# Patient Record
Sex: Male | Born: 1945 | Race: White | Hispanic: No | Marital: Married | Smoking: Former smoker
Health system: Southern US, Community
[De-identification: ages and names within clinical notes are randomized; demographics above are authoritative.]

## PROBLEM LIST (undated history)

## (undated) DIAGNOSIS — S88119A Complete traumatic amputation at level between knee and ankle, unspecified lower leg, initial encounter: Secondary | ICD-10-CM

## (undated) DIAGNOSIS — K259 Gastric ulcer, unspecified as acute or chronic, without hemorrhage or perforation: Secondary | ICD-10-CM

## (undated) DIAGNOSIS — M25562 Pain in left knee: Secondary | ICD-10-CM

## (undated) DIAGNOSIS — M25552 Pain in left hip: Secondary | ICD-10-CM

## (undated) DIAGNOSIS — E785 Hyperlipidemia, unspecified: Secondary | ICD-10-CM

## (undated) DIAGNOSIS — R739 Hyperglycemia, unspecified: Secondary | ICD-10-CM

## (undated) DIAGNOSIS — M1990 Unspecified osteoarthritis, unspecified site: Secondary | ICD-10-CM

## (undated) DIAGNOSIS — G5621 Lesion of ulnar nerve, right upper limb: Secondary | ICD-10-CM

## (undated) DIAGNOSIS — F419 Anxiety disorder, unspecified: Secondary | ICD-10-CM

## (undated) DIAGNOSIS — M542 Cervicalgia: Secondary | ICD-10-CM

## (undated) DIAGNOSIS — Z969 Presence of functional implant, unspecified: Secondary | ICD-10-CM

## (undated) DIAGNOSIS — R569 Unspecified convulsions: Secondary | ICD-10-CM

## (undated) DIAGNOSIS — M5030 Other cervical disc degeneration, unspecified cervical region: Secondary | ICD-10-CM

## (undated) DIAGNOSIS — M549 Dorsalgia, unspecified: Secondary | ICD-10-CM

## (undated) DIAGNOSIS — G8929 Other chronic pain: Secondary | ICD-10-CM

## (undated) DIAGNOSIS — K5790 Diverticulosis of intestine, part unspecified, without perforation or abscess without bleeding: Secondary | ICD-10-CM

## (undated) DIAGNOSIS — M48061 Spinal stenosis, lumbar region without neurogenic claudication: Secondary | ICD-10-CM

## (undated) DIAGNOSIS — S329XXA Fracture of unspecified parts of lumbosacral spine and pelvis, initial encounter for closed fracture: Secondary | ICD-10-CM

## (undated) DIAGNOSIS — M25579 Pain in unspecified ankle and joints of unspecified foot: Secondary | ICD-10-CM

## (undated) DIAGNOSIS — C801 Malignant (primary) neoplasm, unspecified: Secondary | ICD-10-CM

## (undated) DIAGNOSIS — G4700 Insomnia, unspecified: Secondary | ICD-10-CM

## (undated) DIAGNOSIS — G5602 Carpal tunnel syndrome, left upper limb: Secondary | ICD-10-CM

## (undated) HISTORY — DX: Pain in left hip: M25.552

## (undated) HISTORY — PX: TONSILLECTOMY: SUR1361

## (undated) HISTORY — DX: Other cervical disc degeneration, unspecified cervical region: M50.30

## (undated) HISTORY — DX: Pain in left knee: M25.562

## (undated) HISTORY — DX: Carpal tunnel syndrome, left upper limb: G56.02

## (undated) HISTORY — PX: HERNIA REPAIR: SHX51

## (undated) HISTORY — PX: CARPAL TUNNEL RELEASE: SHX101

## (undated) HISTORY — DX: Cervicalgia: M54.2

## (undated) HISTORY — PX: CATARACT EXTRACTION, BILATERAL: SHX1313

## (undated) HISTORY — PX: OTHER SURGICAL HISTORY: SHX169

## (undated) HISTORY — PX: NERVE REPAIR: SHX2083

## (undated) HISTORY — PX: SKIN CANCER EXCISION: SHX779

## (undated) HISTORY — DX: Fracture of unspecified parts of lumbosacral spine and pelvis, initial encounter for closed fracture: S32.9XXA

## (undated) HISTORY — PX: RETINAL DETACHMENT SURGERY: SHX105

## (undated) HISTORY — DX: Gastric ulcer, unspecified as acute or chronic, without hemorrhage or perforation: K25.9

## (undated) HISTORY — DX: Diverticulosis of intestine, part unspecified, without perforation or abscess without bleeding: K57.90

## (undated) HISTORY — DX: Lesion of ulnar nerve, right upper limb: G56.21

---
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
## 1970-07-01 HISTORY — PX: BACK SURGERY: SHX140

## 1987-07-02 DIAGNOSIS — K513 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: Secondary | ICD-10-CM | POA: Insufficient documentation

## 1999-05-30 ENCOUNTER — Emergency Department (HOSPITAL_COMMUNITY): Admission: EM | Admit: 1999-05-30 | Discharge: 1999-05-30 | Payer: Self-pay | Admitting: *Deleted

## 2002-08-09 ENCOUNTER — Encounter: Payer: Self-pay | Admitting: Internal Medicine

## 2002-08-09 ENCOUNTER — Encounter: Admission: RE | Admit: 2002-08-09 | Discharge: 2002-08-09 | Payer: Self-pay | Admitting: Internal Medicine

## 2004-08-14 ENCOUNTER — Encounter: Admission: RE | Admit: 2004-08-14 | Discharge: 2004-08-14 | Payer: Self-pay | Admitting: Internal Medicine

## 2005-05-04 ENCOUNTER — Emergency Department (HOSPITAL_COMMUNITY): Admission: EM | Admit: 2005-05-04 | Discharge: 2005-05-04 | Payer: Self-pay | Admitting: Emergency Medicine

## 2011-10-31 DIAGNOSIS — E785 Hyperlipidemia, unspecified: Secondary | ICD-10-CM | POA: Diagnosis not present

## 2011-10-31 DIAGNOSIS — Z79899 Other long term (current) drug therapy: Secondary | ICD-10-CM | POA: Diagnosis not present

## 2011-10-31 DIAGNOSIS — Z125 Encounter for screening for malignant neoplasm of prostate: Secondary | ICD-10-CM | POA: Diagnosis not present

## 2011-11-01 DIAGNOSIS — Z1212 Encounter for screening for malignant neoplasm of rectum: Secondary | ICD-10-CM | POA: Diagnosis not present

## 2011-11-07 DIAGNOSIS — M25559 Pain in unspecified hip: Secondary | ICD-10-CM | POA: Diagnosis not present

## 2011-11-07 DIAGNOSIS — R7309 Other abnormal glucose: Secondary | ICD-10-CM | POA: Diagnosis not present

## 2011-11-07 DIAGNOSIS — M48061 Spinal stenosis, lumbar region without neurogenic claudication: Secondary | ICD-10-CM | POA: Diagnosis not present

## 2011-11-07 DIAGNOSIS — Z0000 Encounter for general adult medical examination without abnormal findings: Secondary | ICD-10-CM | POA: Diagnosis not present

## 2011-11-12 DIAGNOSIS — H01009 Unspecified blepharitis unspecified eye, unspecified eyelid: Secondary | ICD-10-CM | POA: Diagnosis not present

## 2011-11-12 DIAGNOSIS — H2510 Age-related nuclear cataract, unspecified eye: Secondary | ICD-10-CM | POA: Diagnosis not present

## 2011-11-28 DIAGNOSIS — M5137 Other intervertebral disc degeneration, lumbosacral region: Secondary | ICD-10-CM | POA: Diagnosis not present

## 2011-11-28 DIAGNOSIS — M549 Dorsalgia, unspecified: Secondary | ICD-10-CM | POA: Diagnosis not present

## 2012-01-13 ENCOUNTER — Encounter (HOSPITAL_COMMUNITY): Payer: Self-pay | Admitting: Emergency Medicine

## 2012-01-13 ENCOUNTER — Observation Stay (HOSPITAL_COMMUNITY)
Admission: EM | Admit: 2012-01-13 | Discharge: 2012-01-14 | Disposition: A | Discharge status: Home / Self Care | Payer: Medicare Other | Attending: Internal Medicine | Admitting: Internal Medicine

## 2012-01-13 ENCOUNTER — Emergency Department (HOSPITAL_COMMUNITY): Payer: Medicare Other

## 2012-01-13 DIAGNOSIS — E785 Hyperlipidemia, unspecified: Secondary | ICD-10-CM | POA: Diagnosis not present

## 2012-01-13 DIAGNOSIS — Z8673 Personal history of transient ischemic attack (TIA), and cerebral infarction without residual deficits: Secondary | ICD-10-CM | POA: Diagnosis not present

## 2012-01-13 DIAGNOSIS — M25579 Pain in unspecified ankle and joints of unspecified foot: Secondary | ICD-10-CM | POA: Diagnosis not present

## 2012-01-13 DIAGNOSIS — G8929 Other chronic pain: Secondary | ICD-10-CM | POA: Diagnosis not present

## 2012-01-13 DIAGNOSIS — M545 Low back pain, unspecified: Secondary | ICD-10-CM | POA: Insufficient documentation

## 2012-01-13 DIAGNOSIS — R5383 Other fatigue: Secondary | ICD-10-CM | POA: Diagnosis not present

## 2012-01-13 DIAGNOSIS — G319 Degenerative disease of nervous system, unspecified: Secondary | ICD-10-CM | POA: Diagnosis not present

## 2012-01-13 DIAGNOSIS — R7309 Other abnormal glucose: Secondary | ICD-10-CM | POA: Diagnosis not present

## 2012-01-13 DIAGNOSIS — S82899A Other fracture of unspecified lower leg, initial encounter for closed fracture: Secondary | ICD-10-CM | POA: Diagnosis not present

## 2012-01-13 DIAGNOSIS — R55 Syncope and collapse: Principal | ICD-10-CM | POA: Insufficient documentation

## 2012-01-13 DIAGNOSIS — M62838 Other muscle spasm: Secondary | ICD-10-CM | POA: Insufficient documentation

## 2012-01-13 DIAGNOSIS — R404 Transient alteration of awareness: Secondary | ICD-10-CM | POA: Diagnosis not present

## 2012-01-13 DIAGNOSIS — R569 Unspecified convulsions: Secondary | ICD-10-CM | POA: Diagnosis not present

## 2012-01-13 DIAGNOSIS — R5381 Other malaise: Secondary | ICD-10-CM | POA: Insufficient documentation

## 2012-01-13 DIAGNOSIS — M48061 Spinal stenosis, lumbar region without neurogenic claudication: Secondary | ICD-10-CM | POA: Diagnosis not present

## 2012-01-13 DIAGNOSIS — E876 Hypokalemia: Secondary | ICD-10-CM | POA: Diagnosis not present

## 2012-01-13 DIAGNOSIS — Z79899 Other long term (current) drug therapy: Secondary | ICD-10-CM | POA: Insufficient documentation

## 2012-01-13 DIAGNOSIS — R739 Hyperglycemia, unspecified: Secondary | ICD-10-CM | POA: Diagnosis present

## 2012-01-13 DIAGNOSIS — R509 Fever, unspecified: Secondary | ICD-10-CM | POA: Insufficient documentation

## 2012-01-13 DIAGNOSIS — S88119A Complete traumatic amputation at level between knee and ankle, unspecified lower leg, initial encounter: Secondary | ICD-10-CM | POA: Diagnosis not present

## 2012-01-13 DIAGNOSIS — M25572 Pain in left ankle and joints of left foot: Secondary | ICD-10-CM | POA: Diagnosis present

## 2012-01-13 DIAGNOSIS — R61 Generalized hyperhidrosis: Secondary | ICD-10-CM | POA: Diagnosis not present

## 2012-01-13 HISTORY — DX: Insomnia, unspecified: G47.00

## 2012-01-13 HISTORY — DX: Complete traumatic amputation at level between knee and ankle, unspecified lower leg, initial encounter: S88.119A

## 2012-01-13 HISTORY — DX: Hyperglycemia, unspecified: R73.9

## 2012-01-13 HISTORY — DX: Other chronic pain: G89.29

## 2012-01-13 HISTORY — DX: Spinal stenosis, lumbar region without neurogenic claudication: M48.061

## 2012-01-13 HISTORY — DX: Anxiety disorder, unspecified: F41.9

## 2012-01-13 HISTORY — DX: Pain in unspecified ankle and joints of unspecified foot: M25.579

## 2012-01-13 HISTORY — DX: Unspecified osteoarthritis, unspecified site: M19.90

## 2012-01-13 HISTORY — DX: Dorsalgia, unspecified: M54.9

## 2012-01-13 HISTORY — DX: Hyperlipidemia, unspecified: E78.5

## 2012-01-13 LAB — BASIC METABOLIC PANEL
BUN: 19 mg/dL (ref 6–23)
CO2: 28 mEq/L (ref 19–32)
Calcium: 9.2 mg/dL (ref 8.4–10.5)
Chloride: 101 mEq/L (ref 96–112)
Creatinine, Ser: 0.81 mg/dL (ref 0.50–1.35)
GFR calc Af Amer: >90 mL/min (ref >90)
GFR calc non Af Amer: 89 mL/min — ABNORMAL LOW (ref >90)
Glucose, Bld: 137 mg/dL — ABNORMAL HIGH (ref 70–99)
Potassium: 3.4 mEq/L — ABNORMAL LOW (ref 3.5–5.1)
Sodium: 139 mEq/L (ref 135–145)

## 2012-01-13 LAB — CBC WITH DIFFERENTIAL/PLATELET
Basophils Absolute: 0 10*3/uL (ref 0.0–0.1)
Basophils Relative: 0 % (ref 0–1)
Eosinophils Absolute: 0.4 10*3/uL (ref 0.0–0.7)
Eosinophils Relative: 4 % (ref 0–5)
HCT: 38.4 % — ABNORMAL LOW (ref 39.0–52.0)
Hemoglobin: 12.6 g/dL — ABNORMAL LOW (ref 13.0–17.0)
Lymphocytes Relative: 20 % (ref 12–46)
Lymphs Abs: 2.1 10*3/uL (ref 0.7–4.0)
MCH: 27.2 pg (ref 26.0–34.0)
MCHC: 32.8 g/dL (ref 30.0–36.0)
MCV: 82.8 fL (ref 78.0–100.0)
Monocytes Absolute: 0.9 10*3/uL (ref 0.1–1.0)
Monocytes Relative: 9 % (ref 3–12)
Neutro Abs: 7.1 10*3/uL (ref 1.7–7.7)
Neutrophils Relative %: 67 % (ref 43–77)
Platelets: 398 10*3/uL (ref 150–400)
RBC: 4.64 MIL/uL (ref 4.22–5.81)
RDW: 14.8 % (ref 11.5–15.5)
WBC: 10.5 10*3/uL (ref 4.0–10.5)

## 2012-01-13 LAB — TROPONIN I: Troponin I: <0.3 ng/mL (ref <0.30)

## 2012-01-13 LAB — GLUCOSE, CAPILLARY: Glucose-Capillary: 119 mg/dL — ABNORMAL HIGH (ref 70–99)

## 2012-01-13 NOTE — ED Provider Notes (Signed)
History     CSN: [id number]  Arrival date & time 01/13/12  2128   First MD Initiated Contact with Patient 01/13/12 2301      Chief Complaint  Patient presents with  . Loss of Consciousness    (Consider location/radiation/quality/duration/timing/severity/associated sxs/prior treatment) Patient is a 66 y.o. male presenting with syncope. The history is provided by the patient. No language interpreter was used.  Loss of Consciousness This is a new problem. The current episode started 3 to 5 hours ago. The problem occurs constantly. The problem has been resolved. Pertinent negatives include no chest pain, no abdominal pain, no headaches and no shortness of breath. Nothing aggravates the symptoms. Nothing relieves the symptoms. He has tried nothing for the symptoms. The treatment provided no relief.  No CP, no SOB no n/v/d.  No weakness nor numbness  Past Medical History  Diagnosis Date  . Back pain, chronic     Past Surgical History  Procedure Date  . Below the knee amputaion   . Anterior cervical decomp/discectomy fusion   . Hernia repair     History reviewed. No pertinent family history.  History  Substance Use Topics  . Smoking status: Never Smoker   . Smokeless tobacco: Not on file  . Alcohol Use: No      Review of Systems  Constitutional: Negative for fever.  Respiratory: Negative for shortness of breath.   Cardiovascular: Positive for syncope. Negative for chest pain.  Gastrointestinal: Negative for abdominal pain.  Neurological: Positive for syncope. Negative for headaches.  All other systems reviewed and are negative.    Allergies  Cephalosporins  Home Medications   Current Outpatient Rx  Name Route Sig Dispense Refill  . AMITRIPTYLINE HCL 100 MG PO TABS Oral Take 100 mg by mouth at bedtime.    . COLESTIPOL HCL 1 G PO TABS Oral Take 2 g by mouth 2 (two) times daily.    Marland Kitchen DICLOFENAC SODIUM 75 MG PO TBEC Oral Take 75 mg by mouth 2 (two) times daily.      . OMEGA-3 FATTY ACIDS 1000 MG PO CAPS Oral Take 1 g by mouth 2 (two) times daily before a meal.    . HYDROCODONE-ACETAMINOPHEN 5-325 MG PO TABS Oral Take 1 tablet by mouth every 6 (six) hours as needed. For pain.    Marland Kitchen TRAMADOL HCL 50 MG PO TABS Oral Take 100 mg by mouth every 6 (six) hours as needed. For pain.      BP 139/87  Pulse 71  Temp 97.7 F (36.5 C) (Oral)  Resp 18  SpO2 99%  Physical Exam  Constitutional: He appears well-developed and well-nourished. No distress.  HENT:  Head: Normocephalic.    Right Ear: External ear normal.  Left Ear: External ear normal.  Mouth/Throat: Oropharynx is clear and moist.  Eyes: Conjunctivae and EOM are normal. Pupils are equal, round, and reactive to light.  Neck: Normal range of motion. Neck supple.  Cardiovascular: Normal rate and regular rhythm.   Pulmonary/Chest: Effort normal and breath sounds normal. He has no wheezes. He has no rales.  Abdominal: Soft. Bowel sounds are normal. There is no tenderness. There is no rebound and no guarding.  Musculoskeletal: Normal range of motion. He exhibits no edema.  Neurological: He is alert. He has normal reflexes. No cranial nerve deficit.  Skin: Skin is warm and dry.  Psychiatric: He has a normal mood and affect.    ED Course  Procedures (including critical care time)  Labs Reviewed  CBC WITH DIFFERENTIAL - Abnormal; Notable for the following:    Hemoglobin 12.6 (*)     HCT 38.4 (*)     All other components within normal limits  BASIC METABOLIC PANEL - Abnormal; Notable for the following:    Potassium 3.4 (*)     Glucose, Bld 137 (*)     GFR calc non Af Amer 89 (*)     All other components within normal limits  GLUCOSE, CAPILLARY - Abnormal; Notable for the following:    Glucose-Capillary 119 (*)     All other components within normal limits  TROPONIN I  URINALYSIS, ROUTINE W REFLEX MICROSCOPIC  URINE CULTURE   No results found.   No diagnosis found.    MDM   Date:  01/13/2012  Rate: 69  Rhythm: normal sinus rhythm  QRS Axis: normal  Intervals: normal  ST/T Wave abnormalities: nonspecific ST changes  Conduction Disutrbances:first-degree A-V block   Narrative Interpretation:   Old EKG Reviewed: none available    Based on [place] syncope score patient will require admission.    Case d/w neurology, Dr. Roseanne Reno.  This is a syncopal event as this started while patient was standing and no sz      Adonnis Salceda K Renu Asby-Rasch, MD 01/14/12 [phone]

## 2012-01-13 NOTE — ED Notes (Signed)
Family states this evening pt was standing behind her and she heard him making some strange noises then fell to the ground  Wife states his hands were drawn up and he was grabbing at her and shaking  Wife states after the event he was confused and diaphoretic  EMS came out to the house but pt refused transport  Pt is pale and diaphoretic in triage  Pt is c/o nausea   Pt has a small abrasion noted to the left temporal area from his fall

## 2012-01-13 NOTE — ED Notes (Signed)
Pt's family reports that pt "passed out" and hit left side of head this evening around 8pm. States that pt made a "strange chomping sound" while he was passed out and they thought he may have had a seizure. Pt has no history of seizures. Pt does admit to nausea and dry mouth. States that he did yard work and did not drink "a lot" of water today. Pt denies dysuria, urinary frequency or retention. Pt alert and oriented x 4, neuro intact at this time. Family at the bedside.

## 2012-01-13 NOTE — ED Notes (Signed)
[id number]> Expected date:<BR> Expected time:<BR> Means of arrival:<BR> Comments:<BR> Jill Side

## 2012-01-14 ENCOUNTER — Encounter (HOSPITAL_COMMUNITY): Payer: Self-pay | Admitting: Internal Medicine

## 2012-01-14 DIAGNOSIS — E785 Hyperlipidemia, unspecified: Secondary | ICD-10-CM | POA: Diagnosis present

## 2012-01-14 DIAGNOSIS — M48061 Spinal stenosis, lumbar region without neurogenic claudication: Secondary | ICD-10-CM | POA: Diagnosis present

## 2012-01-14 DIAGNOSIS — E876 Hypokalemia: Secondary | ICD-10-CM | POA: Diagnosis not present

## 2012-01-14 DIAGNOSIS — R7309 Other abnormal glucose: Secondary | ICD-10-CM | POA: Diagnosis not present

## 2012-01-14 DIAGNOSIS — R55 Syncope and collapse: Secondary | ICD-10-CM | POA: Diagnosis not present

## 2012-01-14 DIAGNOSIS — S82899A Other fracture of unspecified lower leg, initial encounter for closed fracture: Secondary | ICD-10-CM | POA: Diagnosis not present

## 2012-01-14 DIAGNOSIS — M25572 Pain in left ankle and joints of left foot: Secondary | ICD-10-CM | POA: Diagnosis present

## 2012-01-14 DIAGNOSIS — R739 Hyperglycemia, unspecified: Secondary | ICD-10-CM | POA: Diagnosis present

## 2012-01-14 LAB — URINALYSIS, ROUTINE W REFLEX MICROSCOPIC
Bilirubin Urine: NEGATIVE
Glucose, UA: NEGATIVE mg/dL
Hgb urine dipstick: NEGATIVE
Ketones, ur: NEGATIVE mg/dL
Leukocytes, UA: NEGATIVE
Nitrite: NEGATIVE
Protein, ur: NEGATIVE mg/dL
Specific Gravity, Urine: 1.027 (ref 1.005–1.030)
Urobilinogen, UA: 0.2 mg/dL (ref 0.0–1.0)
pH: 6.5 (ref 5.0–8.0)

## 2012-01-14 MED ORDER — ACETAMINOPHEN 325 MG PO TABS: 650.0000 mg | ORAL_TABLET | Freq: Four times a day (QID) | ORAL | Status: DC | PRN

## 2012-01-14 MED ORDER — TRAMADOL HCL 50 MG PO TABS: 100.0000 mg | ORAL_TABLET | Freq: Four times a day (QID) | ORAL | Status: DC | PRN

## 2012-01-14 MED ORDER — ALPRAZOLAM 1 MG PO TABS: 1.0000 mg | ORAL_TABLET | Freq: Every day | ORAL | Status: DC

## 2012-01-14 MED ORDER — PROMETHAZINE HCL 25 MG PO TABS: 12.5000 mg | ORAL_TABLET | Freq: Four times a day (QID) | ORAL | Status: DC | PRN

## 2012-01-14 MED ORDER — COLESTIPOL HCL 1 G PO TABS
2.0000 g | ORAL_TABLET | Freq: Two times a day (BID) | ORAL | Status: DC
  Administered 2012-01-14: 2 g via ORAL
  Filled 2012-01-14 (×2): qty 2

## 2012-01-14 MED ORDER — AMITRIPTYLINE HCL 100 MG PO TABS
100.0000 mg | ORAL_TABLET | Freq: Every day | ORAL | Status: DC
  Filled 2012-01-14: qty 1

## 2012-01-14 MED ORDER — ALUM & MAG HYDROXIDE-SIMETH 200-200-20 MG/5ML PO SUSP: 30.0000 mL | Freq: Four times a day (QID) | ORAL | Status: DC | PRN

## 2012-01-14 MED ORDER — MAGNESIUM CITRATE PO SOLN: 1.0000 | Freq: Once | ORAL | Status: DC | PRN

## 2012-01-14 MED ORDER — BISACODYL 5 MG PO TBEC: 5.0000 mg | DELAYED_RELEASE_TABLET | Freq: Every day | ORAL | Status: DC | PRN

## 2012-01-14 MED ORDER — SODIUM CHLORIDE 0.9 % IJ SOLN: 3.0000 mL | Freq: Two times a day (BID) | INTRAMUSCULAR | Status: DC

## 2012-01-14 MED ORDER — TRAMADOL HCL 50 MG PO TABS: 100.0000 mg | ORAL_TABLET | Freq: Three times a day (TID) | ORAL | Status: DC | PRN

## 2012-01-14 MED ORDER — HYDROCODONE-ACETAMINOPHEN 5-325 MG PO TABS: 1.0000 | ORAL_TABLET | Freq: Four times a day (QID) | ORAL | Status: DC | PRN

## 2012-01-14 MED ORDER — ENOXAPARIN SODIUM 40 MG/0.4ML ~~LOC~~ SOLN
40.0000 mg | SUBCUTANEOUS | Status: DC
  Administered 2012-01-14: 40 mg via SUBCUTANEOUS
  Filled 2012-01-14: qty 0.4

## 2012-01-14 MED ORDER — OMEGA-3-ACID ETHYL ESTERS 1 G PO CAPS
1.0000 g | ORAL_CAPSULE | Freq: Two times a day (BID) | ORAL | Status: DC
  Administered 2012-01-14: 1 g via ORAL
  Filled 2012-01-14 (×2): qty 1

## 2012-01-14 MED ORDER — ACETAMINOPHEN 650 MG RE SUPP: 650.0000 mg | Freq: Four times a day (QID) | RECTAL | Status: DC | PRN

## 2012-01-14 MED ORDER — DICLOFENAC SODIUM 75 MG PO TBEC
75.0000 mg | DELAYED_RELEASE_TABLET | Freq: Two times a day (BID) | ORAL | Status: DC
  Administered 2012-01-14: 75 mg via ORAL
  Filled 2012-01-14 (×2): qty 1

## 2012-01-14 MED ORDER — ASPIRIN EC 81 MG PO TBEC
81.0000 mg | DELAYED_RELEASE_TABLET | Freq: Every day | ORAL | Status: DC
  Administered 2012-01-14: 81 mg via ORAL
  Filled 2012-01-14: qty 1

## 2012-01-14 MED ORDER — ALPRAZOLAM 1 MG PO TABS: 1.0000 mg | ORAL_TABLET | Freq: Every day | ORAL | Status: AC

## 2012-01-14 MED ORDER — OMEGA-3 FATTY ACIDS 1000 MG PO CAPS: 1.0000 g | ORAL_CAPSULE | Freq: Two times a day (BID) | ORAL | Status: DC

## 2012-01-14 NOTE — Progress Notes (Signed)
Patient discharge to home no complaints of pain or any discomfort, wife at bedside, d/c instructions and follow up appointments done and given to the wife. PIV removed no s/s of infiltration or swelling noted

## 2012-01-14 NOTE — Progress Notes (Signed)
  Echocardiogram 2D Echocardiogram has been performed.  Tallon Gertz 01/14/2012, 12:27 PM

## 2012-01-14 NOTE — ED Notes (Signed)
Admitting doc at bedside

## 2012-01-14 NOTE — Care Management Note (Unsigned)
    Page 1 of 1   01/14/2012     11:38:31 AM   CARE MANAGEMENT NOTE 01/14/2012  Patient:  Allen Taylor, Allen Taylor   Account Number:  [id number]  Date Initiated:  01/14/2012  Documentation initiated by:  Lanier Clam  Subjective/Objective Assessment:   ADMITTED W/LOSS OF CONSCIOUSNESS.     Action/Plan:   FROM HOME W/SPOUSE.INDEP PTA.   Anticipated DC Date:  01/15/2012   Anticipated DC Plan:  HOME/SELF CARE      DC Planning Services  CM consult      Choice offered to / List presented to:             Status of service:  In process, will continue to follow Medicare Important Message given?   (If response is "NO", the following Medicare IM given date fields will be blank) Date Medicare IM given:   Date Additional Medicare IM given:    Discharge Disposition:    Per UR Regulation:  Reviewed for med. necessity/level of care/duration of stay  If discussed at Long Length of Stay Meetings, dates discussed:    Comments:  01/14/12 [facility] RN,BSN [phone]

## 2012-01-14 NOTE — ED Notes (Signed)
Pt alert and oriented x4. Respirations even and unlabored, bilateral symmetrical rise and fall of chest. Skin warm and dry. In no acute distress. Denies needs.   

## 2012-01-14 NOTE — ED Notes (Signed)
Report given to grace, rn on floor.
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
## 2012-01-15 LAB — URINE CULTURE
Colony Count: NO GROWTH
Culture: NO GROWTH

## 2012-01-20 DIAGNOSIS — M48061 Spinal stenosis, lumbar region without neurogenic claudication: Secondary | ICD-10-CM | POA: Diagnosis not present

## 2012-01-20 DIAGNOSIS — M25579 Pain in unspecified ankle and joints of unspecified foot: Secondary | ICD-10-CM | POA: Diagnosis not present

## 2012-01-20 DIAGNOSIS — E785 Hyperlipidemia, unspecified: Secondary | ICD-10-CM | POA: Diagnosis not present

## 2012-01-20 DIAGNOSIS — Z8669 Personal history of other diseases of the nervous system and sense organs: Secondary | ICD-10-CM | POA: Diagnosis not present

## 2012-02-20 DIAGNOSIS — E785 Hyperlipidemia, unspecified: Secondary | ICD-10-CM | POA: Diagnosis not present

## 2012-02-20 DIAGNOSIS — M25579 Pain in unspecified ankle and joints of unspecified foot: Secondary | ICD-10-CM | POA: Diagnosis not present

## 2012-02-20 DIAGNOSIS — M48061 Spinal stenosis, lumbar region without neurogenic claudication: Secondary | ICD-10-CM | POA: Diagnosis not present

## 2012-04-07 ENCOUNTER — Encounter (HOSPITAL_COMMUNITY): Payer: Self-pay | Admitting: *Deleted

## 2012-04-07 ENCOUNTER — Emergency Department (HOSPITAL_COMMUNITY)
Admission: EM | Admit: 2012-04-07 | Discharge: 2012-04-07 | Disposition: A | Discharge status: Home / Self Care | Payer: Medicare Other | Attending: Emergency Medicine | Admitting: Emergency Medicine

## 2012-04-07 ENCOUNTER — Emergency Department (HOSPITAL_COMMUNITY): Payer: Medicare Other

## 2012-04-07 DIAGNOSIS — Z79899 Other long term (current) drug therapy: Secondary | ICD-10-CM | POA: Diagnosis not present

## 2012-04-07 DIAGNOSIS — R569 Unspecified convulsions: Secondary | ICD-10-CM | POA: Diagnosis not present

## 2012-04-07 DIAGNOSIS — I498 Other specified cardiac arrhythmias: Secondary | ICD-10-CM | POA: Diagnosis not present

## 2012-04-07 DIAGNOSIS — G40909 Epilepsy, unspecified, not intractable, without status epilepticus: Secondary | ICD-10-CM | POA: Diagnosis not present

## 2012-04-07 DIAGNOSIS — M549 Dorsalgia, unspecified: Secondary | ICD-10-CM | POA: Diagnosis not present

## 2012-04-07 DIAGNOSIS — G8929 Other chronic pain: Secondary | ICD-10-CM | POA: Insufficient documentation

## 2012-04-07 DIAGNOSIS — R61 Generalized hyperhidrosis: Secondary | ICD-10-CM | POA: Diagnosis not present

## 2012-04-07 DIAGNOSIS — M25579 Pain in unspecified ankle and joints of unspecified foot: Secondary | ICD-10-CM | POA: Diagnosis not present

## 2012-04-07 HISTORY — DX: Unspecified convulsions: R56.9

## 2012-04-07 LAB — CBC WITH DIFFERENTIAL/PLATELET
Basophils Absolute: 0 10*3/uL (ref 0.0–0.1)
Basophils Relative: 0 % (ref 0–1)
Eosinophils Absolute: 0.4 10*3/uL (ref 0.0–0.7)
Eosinophils Relative: 3 % (ref 0–5)
HCT: 41.3 % (ref 39.0–52.0)
Hemoglobin: 13.5 g/dL (ref 13.0–17.0)
Lymphocytes Relative: 15 % (ref 12–46)
Lymphs Abs: 1.9 10*3/uL (ref 0.7–4.0)
MCH: 27.3 pg (ref 26.0–34.0)
MCHC: 32.7 g/dL (ref 30.0–36.0)
MCV: 83.6 fL (ref 78.0–100.0)
Monocytes Absolute: 0.8 10*3/uL (ref 0.1–1.0)
Monocytes Relative: 6 % (ref 3–12)
Neutro Abs: 9.6 10*3/uL — ABNORMAL HIGH (ref 1.7–7.7)
Neutrophils Relative %: 76 % (ref 43–77)
Platelets: 400 10*3/uL (ref 150–400)
RBC: 4.94 MIL/uL (ref 4.22–5.81)
RDW: 13.6 % (ref 11.5–15.5)
WBC: 12.8 10*3/uL — ABNORMAL HIGH (ref 4.0–10.5)

## 2012-04-07 LAB — BASIC METABOLIC PANEL
BUN: 17 mg/dL (ref 6–23)
CO2: 24 mEq/L (ref 19–32)
Calcium: 9.1 mg/dL (ref 8.4–10.5)
Chloride: 103 mEq/L (ref 96–112)
Creatinine, Ser: 0.69 mg/dL (ref 0.50–1.35)
GFR calc Af Amer: >90 mL/min (ref >90)
GFR calc non Af Amer: >90 mL/min (ref >90)
Glucose, Bld: 155 mg/dL — ABNORMAL HIGH (ref 70–99)
Potassium: 3.9 mEq/L (ref 3.5–5.1)
Sodium: 138 mEq/L (ref 135–145)

## 2012-04-07 LAB — MAGNESIUM: Magnesium: 2.6 mg/dL — ABNORMAL HIGH (ref 1.5–2.5)

## 2012-04-07 LAB — RAPID URINE DRUG SCREEN, HOSP PERFORMED
Amphetamines: NOT DETECTED
Barbiturates: NOT DETECTED
Benzodiazepines: NOT DETECTED
Cocaine: NOT DETECTED
Opiates: POSITIVE — AB
Tetrahydrocannabinol: NOT DETECTED

## 2012-04-07 LAB — URINALYSIS, ROUTINE W REFLEX MICROSCOPIC
Bilirubin Urine: NEGATIVE
Glucose, UA: NEGATIVE mg/dL
Hgb urine dipstick: NEGATIVE
Ketones, ur: NEGATIVE mg/dL
Leukocytes, UA: NEGATIVE
Nitrite: NEGATIVE
Protein, ur: NEGATIVE mg/dL
Specific Gravity, Urine: 1.021 (ref 1.005–1.030)
Urobilinogen, UA: 0.2 mg/dL (ref 0.0–1.0)
pH: 6 (ref 5.0–8.0)

## 2012-04-07 LAB — TROPONIN I: Troponin I: <0.3 ng/mL (ref <0.30)

## 2012-04-07 LAB — ETHANOL: Alcohol, Ethyl (B): <11 mg/dL (ref 0–11)
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
## 2012-04-07 NOTE — ED Notes (Signed)
Per EMS- pt had witnessed seizure with familiy. Full body lasting approx 2-3 minutes. Pale diaphoretic upon EMS arrival. Pt was alert and oriented en route with EMS. Pt continues to be diaphoretic. Pt BP 170/102. HR 112. CBG 78. 18g in LAC. Pt has had 1 seizure in the past approx 6 weeks ago.

## 2012-05-11 DIAGNOSIS — H33319 Horseshoe tear of retina without detachment, unspecified eye: Secondary | ICD-10-CM | POA: Diagnosis not present

## 2012-05-11 DIAGNOSIS — H43399 Other vitreous opacities, unspecified eye: Secondary | ICD-10-CM | POA: Diagnosis not present

## 2012-05-11 DIAGNOSIS — H2510 Age-related nuclear cataract, unspecified eye: Secondary | ICD-10-CM | POA: Diagnosis not present

## 2012-05-11 DIAGNOSIS — H4310 Vitreous hemorrhage, unspecified eye: Secondary | ICD-10-CM | POA: Diagnosis not present

## 2012-11-06 DIAGNOSIS — Z79899 Other long term (current) drug therapy: Secondary | ICD-10-CM | POA: Diagnosis not present

## 2012-11-06 DIAGNOSIS — E785 Hyperlipidemia, unspecified: Secondary | ICD-10-CM | POA: Diagnosis not present

## 2012-11-06 DIAGNOSIS — Z125 Encounter for screening for malignant neoplasm of prostate: Secondary | ICD-10-CM | POA: Diagnosis not present

## 2012-11-11 DIAGNOSIS — Z1212 Encounter for screening for malignant neoplasm of rectum: Secondary | ICD-10-CM | POA: Diagnosis not present

## 2012-11-12 DIAGNOSIS — Z125 Encounter for screening for malignant neoplasm of prostate: Secondary | ICD-10-CM | POA: Diagnosis not present

## 2012-11-12 DIAGNOSIS — M79609 Pain in unspecified limb: Secondary | ICD-10-CM | POA: Diagnosis not present

## 2012-11-12 DIAGNOSIS — M48061 Spinal stenosis, lumbar region without neurogenic claudication: Secondary | ICD-10-CM | POA: Diagnosis not present

## 2012-11-12 DIAGNOSIS — K5289 Other specified noninfective gastroenteritis and colitis: Secondary | ICD-10-CM | POA: Diagnosis not present

## 2012-11-12 DIAGNOSIS — Z79899 Other long term (current) drug therapy: Secondary | ICD-10-CM | POA: Diagnosis not present

## 2012-11-12 DIAGNOSIS — F411 Generalized anxiety disorder: Secondary | ICD-10-CM | POA: Diagnosis not present

## 2012-11-12 DIAGNOSIS — Z0000 Encounter for general adult medical examination without abnormal findings: Secondary | ICD-10-CM | POA: Diagnosis not present

## 2012-11-12 DIAGNOSIS — Z6830 Body mass index (BMI) 30.0-30.9, adult: Secondary | ICD-10-CM | POA: Diagnosis not present

## 2012-11-12 DIAGNOSIS — E785 Hyperlipidemia, unspecified: Secondary | ICD-10-CM | POA: Diagnosis not present

## 2012-11-16 DIAGNOSIS — H2510 Age-related nuclear cataract, unspecified eye: Secondary | ICD-10-CM | POA: Diagnosis not present

## 2012-11-16 DIAGNOSIS — H33339 Multiple defects of retina without detachment, unspecified eye: Secondary | ICD-10-CM | POA: Diagnosis not present

## 2013-05-17 DIAGNOSIS — Z6830 Body mass index (BMI) 30.0-30.9, adult: Secondary | ICD-10-CM | POA: Diagnosis not present

## 2013-05-17 DIAGNOSIS — M25579 Pain in unspecified ankle and joints of unspecified foot: Secondary | ICD-10-CM | POA: Diagnosis not present

## 2013-05-17 DIAGNOSIS — L989 Disorder of the skin and subcutaneous tissue, unspecified: Secondary | ICD-10-CM | POA: Diagnosis not present

## 2013-09-29 HISTORY — PX: COLONOSCOPY: SHX174

## 2013-11-08 IMAGING — CT CT HEAD W/O CM
2 series · 16 of 30 positions shown, 20 images · non-contrast
Comparison: [DATE]

CLINICAL DATA: Loss of consciousness, striking left side of the
head.  Possible seizure.

CT HEAD WITHOUT CONTRAST
TECHNIQUE: Contiguous axial images were obtained from the base of
the skull through the vertex without contrast.

[Series 2: head w/o · axial · non-contrast · 0.51mm/px · z∈[-255,-135]mm · 13 of 29 slices shown, 17 images]
[im 3/29  brain]
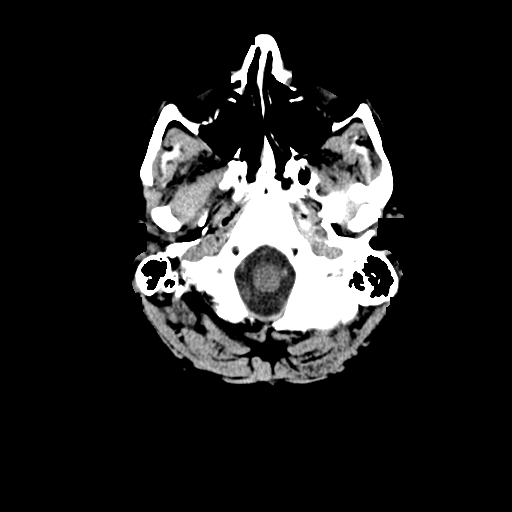
[im 3/29  bone]
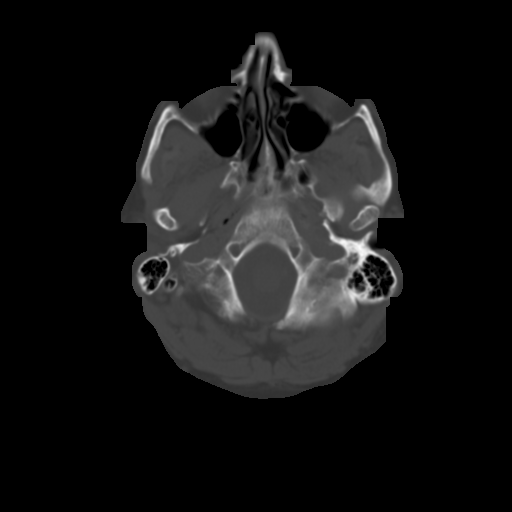
[im 5/29  brain]
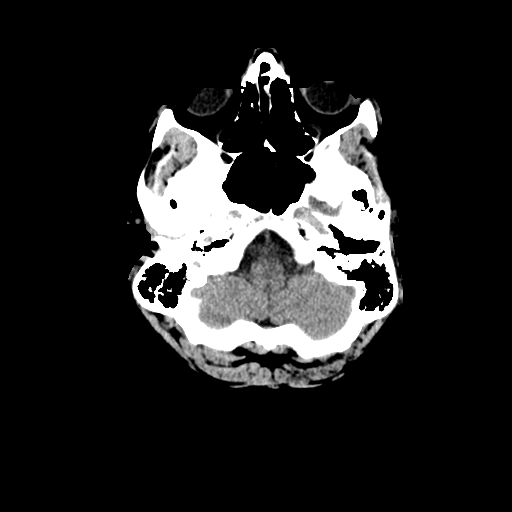
[im 7/29  brain]
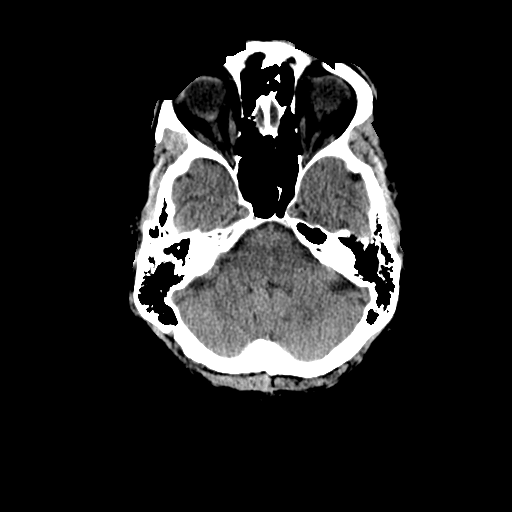
[im 9/29  brain]
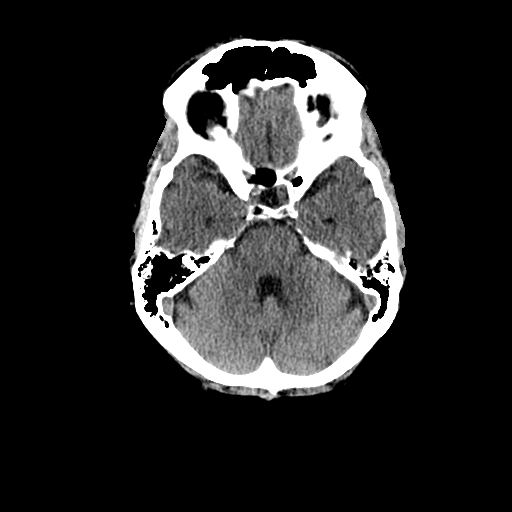
[im 11/29  brain]
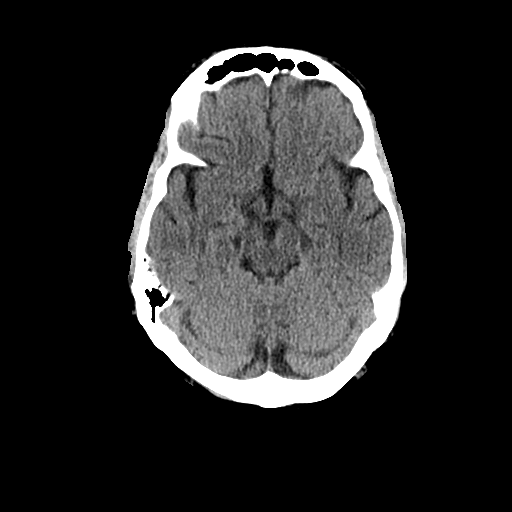
[im 11/29  bone]
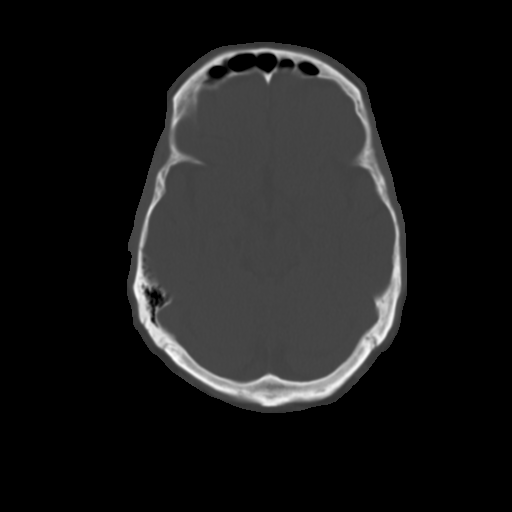
[im 13/29  brain]
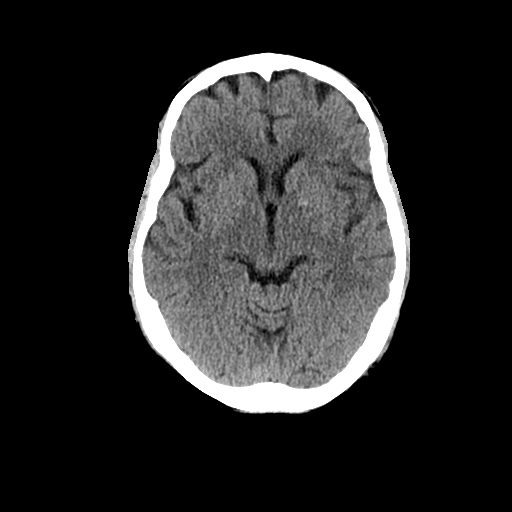
[im 15/29  brain]
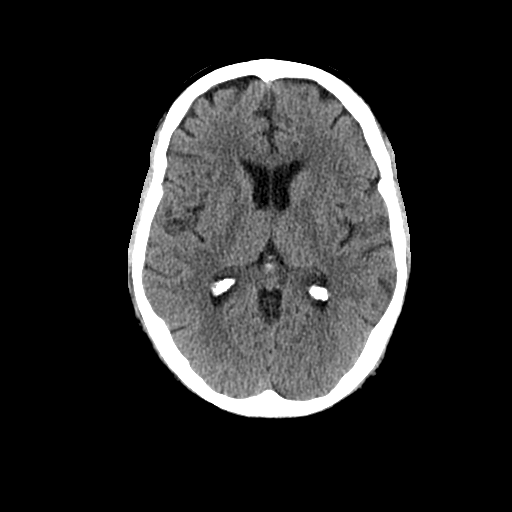
[im 17/29  brain]
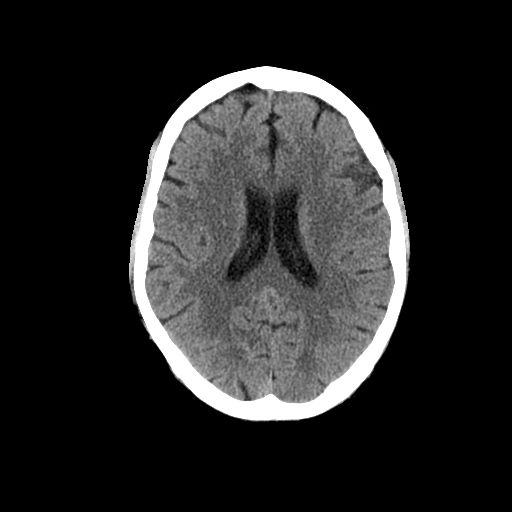
[im 19/29  brain]
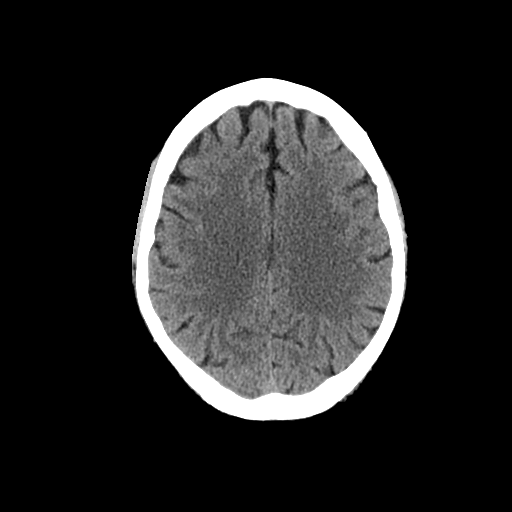
[im 19/29  bone]
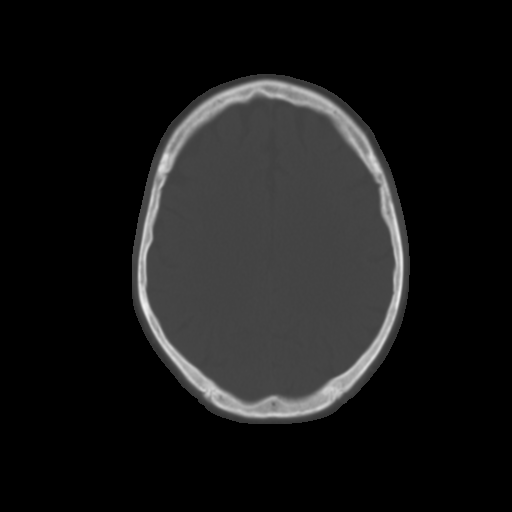
[im 21/29  brain]
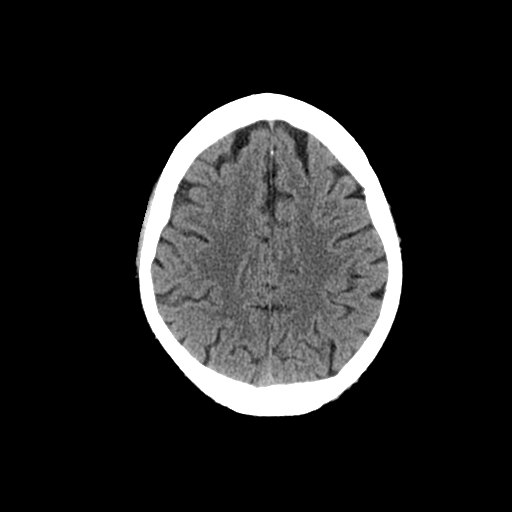
[im 23/29  brain]
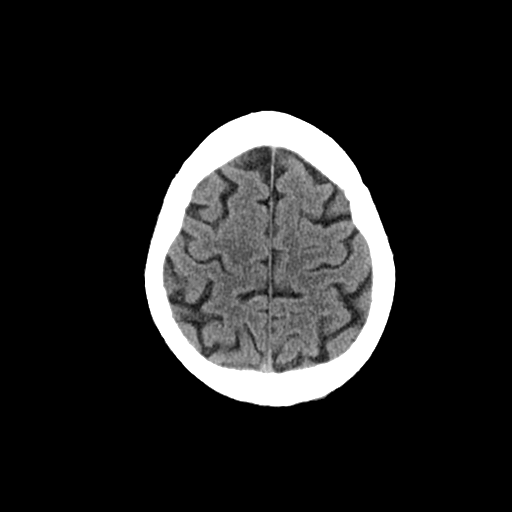
[im 25/29  brain]
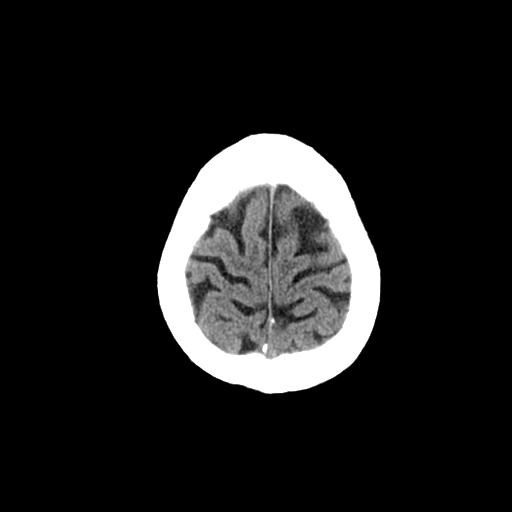
[im 27/29  brain]
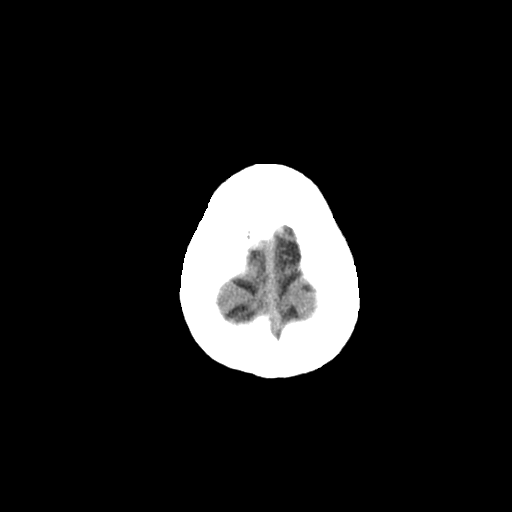
[im 27/29  bone]
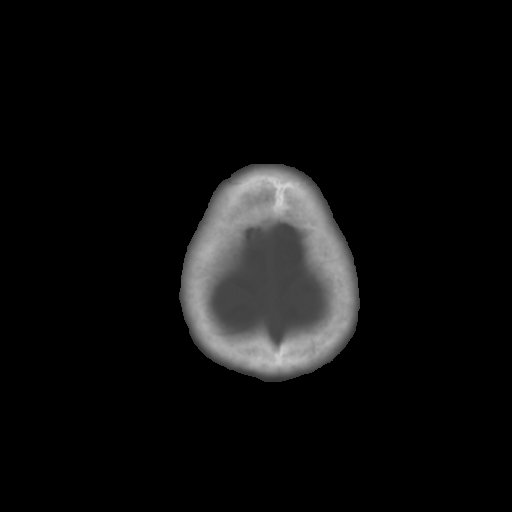

[Series 3: bone windows · axial · 0.51mm/px · z∈[-255,-215]mm · 3 of 29 slices shown]
[im 3/29  bone]
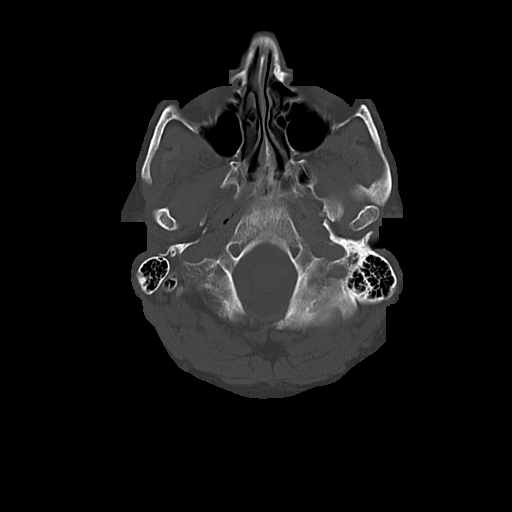
[im 7/29  bone]
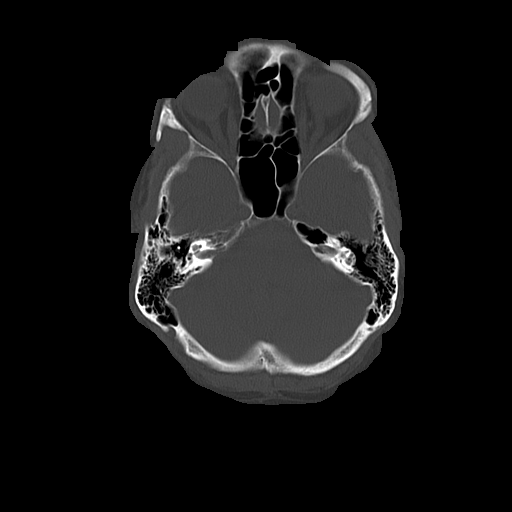
[im 11/29  bone]
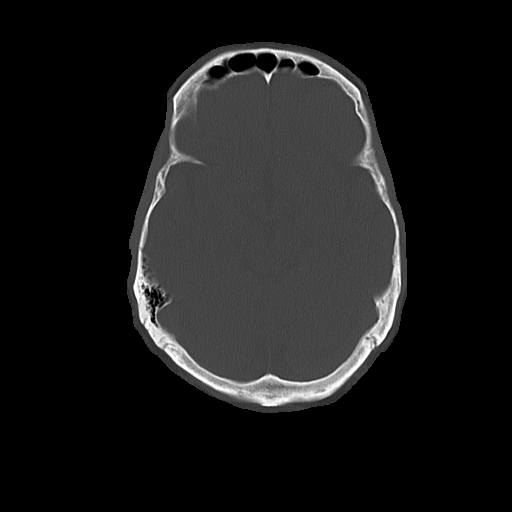

[16 of 30 positions shown; findings below may reference images not displayed]

FINDINGS: Mild diffuse cerebral atrophy.  Old lacunar infarct in
the deep white matter.  No ventricular dilatation.  No mass effect
or midline shift.  No abnormal extra-axial fluid collections.  Gray-
white matter junctions are distinct.  Basal cisterns are not
effaced.  No evidence of acute intracranial hemorrhage.  Visualized
paranasal sinuses and mastoid air cells are not opacified.  No
depressed skull fractures.  No significant changes since the
previous study.
IMPRESSION: No acute intracranial abnormalities.

## 2013-11-10 DIAGNOSIS — Z79899 Other long term (current) drug therapy: Secondary | ICD-10-CM | POA: Diagnosis not present

## 2013-11-10 DIAGNOSIS — E785 Hyperlipidemia, unspecified: Secondary | ICD-10-CM | POA: Diagnosis not present

## 2013-11-10 DIAGNOSIS — Z125 Encounter for screening for malignant neoplasm of prostate: Secondary | ICD-10-CM | POA: Diagnosis not present

## 2013-11-10 DIAGNOSIS — R7309 Other abnormal glucose: Secondary | ICD-10-CM | POA: Diagnosis not present

## 2013-11-17 DIAGNOSIS — H1045 Other chronic allergic conjunctivitis: Secondary | ICD-10-CM | POA: Diagnosis not present

## 2013-11-17 DIAGNOSIS — H33319 Horseshoe tear of retina without detachment, unspecified eye: Secondary | ICD-10-CM | POA: Diagnosis not present

## 2013-11-17 DIAGNOSIS — H2589 Other age-related cataract: Secondary | ICD-10-CM | POA: Diagnosis not present

## 2013-11-18 DIAGNOSIS — E669 Obesity, unspecified: Secondary | ICD-10-CM | POA: Diagnosis not present

## 2013-11-18 DIAGNOSIS — Z1331 Encounter for screening for depression: Secondary | ICD-10-CM | POA: Diagnosis not present

## 2013-11-18 DIAGNOSIS — G4700 Insomnia, unspecified: Secondary | ICD-10-CM | POA: Diagnosis not present

## 2013-11-18 DIAGNOSIS — Z0000 Encounter for general adult medical examination without abnormal findings: Secondary | ICD-10-CM | POA: Diagnosis not present

## 2013-11-18 DIAGNOSIS — Z79899 Other long term (current) drug therapy: Secondary | ICD-10-CM | POA: Diagnosis not present

## 2013-11-18 DIAGNOSIS — F411 Generalized anxiety disorder: Secondary | ICD-10-CM | POA: Diagnosis not present

## 2013-11-18 DIAGNOSIS — M48061 Spinal stenosis, lumbar region without neurogenic claudication: Secondary | ICD-10-CM | POA: Diagnosis not present

## 2013-11-18 DIAGNOSIS — M25579 Pain in unspecified ankle and joints of unspecified foot: Secondary | ICD-10-CM | POA: Diagnosis not present

## 2013-11-18 DIAGNOSIS — E785 Hyperlipidemia, unspecified: Secondary | ICD-10-CM | POA: Diagnosis not present

## 2013-12-08 DIAGNOSIS — R209 Unspecified disturbances of skin sensation: Secondary | ICD-10-CM | POA: Diagnosis not present

## 2013-12-08 DIAGNOSIS — G894 Chronic pain syndrome: Secondary | ICD-10-CM | POA: Diagnosis not present

## 2013-12-08 DIAGNOSIS — M79609 Pain in unspecified limb: Secondary | ICD-10-CM | POA: Diagnosis not present

## 2014-01-12 DIAGNOSIS — G894 Chronic pain syndrome: Secondary | ICD-10-CM | POA: Diagnosis not present

## 2014-01-12 DIAGNOSIS — R209 Unspecified disturbances of skin sensation: Secondary | ICD-10-CM | POA: Diagnosis not present

## 2014-01-12 DIAGNOSIS — M79609 Pain in unspecified limb: Secondary | ICD-10-CM | POA: Diagnosis not present

## 2014-02-08 DIAGNOSIS — R209 Unspecified disturbances of skin sensation: Secondary | ICD-10-CM | POA: Diagnosis not present

## 2014-02-08 DIAGNOSIS — G894 Chronic pain syndrome: Secondary | ICD-10-CM | POA: Diagnosis not present

## 2014-02-08 DIAGNOSIS — M79609 Pain in unspecified limb: Secondary | ICD-10-CM | POA: Diagnosis not present

## 2014-05-23 DIAGNOSIS — Z6831 Body mass index (BMI) 31.0-31.9, adult: Secondary | ICD-10-CM | POA: Diagnosis not present

## 2014-05-23 DIAGNOSIS — R739 Hyperglycemia, unspecified: Secondary | ICD-10-CM | POA: Diagnosis not present

## 2014-05-23 DIAGNOSIS — M4806 Spinal stenosis, lumbar region: Secondary | ICD-10-CM | POA: Diagnosis not present

## 2014-05-23 DIAGNOSIS — M25572 Pain in left ankle and joints of left foot: Secondary | ICD-10-CM | POA: Diagnosis not present

## 2014-11-14 DIAGNOSIS — R739 Hyperglycemia, unspecified: Secondary | ICD-10-CM | POA: Diagnosis not present

## 2014-11-14 DIAGNOSIS — Z125 Encounter for screening for malignant neoplasm of prostate: Secondary | ICD-10-CM | POA: Diagnosis not present

## 2014-11-14 DIAGNOSIS — E785 Hyperlipidemia, unspecified: Secondary | ICD-10-CM | POA: Diagnosis not present

## 2014-11-21 DIAGNOSIS — K529 Noninfective gastroenteritis and colitis, unspecified: Secondary | ICD-10-CM | POA: Diagnosis not present

## 2014-11-21 DIAGNOSIS — M4806 Spinal stenosis, lumbar region: Secondary | ICD-10-CM | POA: Diagnosis not present

## 2014-11-21 DIAGNOSIS — R739 Hyperglycemia, unspecified: Secondary | ICD-10-CM | POA: Diagnosis not present

## 2014-11-21 DIAGNOSIS — L0390 Cellulitis, unspecified: Secondary | ICD-10-CM | POA: Diagnosis not present

## 2014-11-21 DIAGNOSIS — Z6831 Body mass index (BMI) 31.0-31.9, adult: Secondary | ICD-10-CM | POA: Diagnosis not present

## 2014-11-21 DIAGNOSIS — E785 Hyperlipidemia, unspecified: Secondary | ICD-10-CM | POA: Diagnosis not present

## 2014-11-21 DIAGNOSIS — L03116 Cellulitis of left lower limb: Secondary | ICD-10-CM | POA: Diagnosis not present

## 2014-11-21 DIAGNOSIS — M25572 Pain in left ankle and joints of left foot: Secondary | ICD-10-CM | POA: Diagnosis not present

## 2014-11-21 DIAGNOSIS — Z1389 Encounter for screening for other disorder: Secondary | ICD-10-CM | POA: Diagnosis not present

## 2014-11-21 DIAGNOSIS — Z0000 Encounter for general adult medical examination without abnormal findings: Secondary | ICD-10-CM | POA: Diagnosis not present

## 2014-11-21 DIAGNOSIS — F419 Anxiety disorder, unspecified: Secondary | ICD-10-CM | POA: Diagnosis not present

## 2014-11-23 DIAGNOSIS — H25813 Combined forms of age-related cataract, bilateral: Secondary | ICD-10-CM | POA: Diagnosis not present

## 2014-11-23 DIAGNOSIS — H33311 Horseshoe tear of retina without detachment, right eye: Secondary | ICD-10-CM | POA: Diagnosis not present

## 2014-11-23 DIAGNOSIS — H43813 Vitreous degeneration, bilateral: Secondary | ICD-10-CM | POA: Diagnosis not present

## 2014-12-29 DIAGNOSIS — Z6830 Body mass index (BMI) 30.0-30.9, adult: Secondary | ICD-10-CM | POA: Diagnosis not present

## 2014-12-29 DIAGNOSIS — L03116 Cellulitis of left lower limb: Secondary | ICD-10-CM | POA: Diagnosis not present

## 2014-12-29 DIAGNOSIS — E669 Obesity, unspecified: Secondary | ICD-10-CM | POA: Diagnosis not present

## 2014-12-29 DIAGNOSIS — Z1212 Encounter for screening for malignant neoplasm of rectum: Secondary | ICD-10-CM | POA: Diagnosis not present

## 2015-07-01 DIAGNOSIS — H01025 Squamous blepharitis left lower eyelid: Secondary | ICD-10-CM | POA: Diagnosis not present

## 2015-07-01 DIAGNOSIS — H43392 Other vitreous opacities, left eye: Secondary | ICD-10-CM | POA: Diagnosis not present

## 2015-07-01 DIAGNOSIS — H25813 Combined forms of age-related cataract, bilateral: Secondary | ICD-10-CM | POA: Diagnosis not present

## 2015-07-01 DIAGNOSIS — H4312 Vitreous hemorrhage, left eye: Secondary | ICD-10-CM | POA: Diagnosis not present

## 2015-07-01 DIAGNOSIS — H43812 Vitreous degeneration, left eye: Secondary | ICD-10-CM | POA: Diagnosis not present

## 2015-07-01 DIAGNOSIS — H01021 Squamous blepharitis right upper eyelid: Secondary | ICD-10-CM | POA: Diagnosis not present

## 2015-07-01 DIAGNOSIS — H43811 Vitreous degeneration, right eye: Secondary | ICD-10-CM | POA: Diagnosis not present

## 2015-07-01 DIAGNOSIS — H43391 Other vitreous opacities, right eye: Secondary | ICD-10-CM | POA: Diagnosis not present

## 2015-07-01 DIAGNOSIS — H01022 Squamous blepharitis right lower eyelid: Secondary | ICD-10-CM | POA: Diagnosis not present

## 2015-07-01 DIAGNOSIS — H01024 Squamous blepharitis left upper eyelid: Secondary | ICD-10-CM | POA: Diagnosis not present

## 2015-07-06 DIAGNOSIS — H33322 Round hole, left eye: Secondary | ICD-10-CM | POA: Diagnosis not present

## 2015-07-06 DIAGNOSIS — H43392 Other vitreous opacities, left eye: Secondary | ICD-10-CM | POA: Diagnosis not present

## 2015-07-06 DIAGNOSIS — H2513 Age-related nuclear cataract, bilateral: Secondary | ICD-10-CM | POA: Diagnosis not present

## 2015-07-06 DIAGNOSIS — H4312 Vitreous hemorrhage, left eye: Secondary | ICD-10-CM | POA: Diagnosis not present

## 2015-07-06 DIAGNOSIS — H35412 Lattice degeneration of retina, left eye: Secondary | ICD-10-CM | POA: Diagnosis not present

## 2015-11-17 DIAGNOSIS — E784 Other hyperlipidemia: Secondary | ICD-10-CM | POA: Diagnosis not present

## 2015-11-17 DIAGNOSIS — R7309 Other abnormal glucose: Secondary | ICD-10-CM | POA: Diagnosis not present

## 2015-11-17 DIAGNOSIS — Z125 Encounter for screening for malignant neoplasm of prostate: Secondary | ICD-10-CM | POA: Diagnosis not present

## 2015-11-24 DIAGNOSIS — E784 Other hyperlipidemia: Secondary | ICD-10-CM | POA: Diagnosis not present

## 2015-11-24 DIAGNOSIS — Z6831 Body mass index (BMI) 31.0-31.9, adult: Secondary | ICD-10-CM | POA: Diagnosis not present

## 2015-11-24 DIAGNOSIS — M4806 Spinal stenosis, lumbar region: Secondary | ICD-10-CM | POA: Diagnosis not present

## 2015-11-24 DIAGNOSIS — R7309 Other abnormal glucose: Secondary | ICD-10-CM | POA: Diagnosis not present

## 2015-11-24 DIAGNOSIS — Z8669 Personal history of other diseases of the nervous system and sense organs: Secondary | ICD-10-CM | POA: Diagnosis not present

## 2015-11-24 DIAGNOSIS — Z0000 Encounter for general adult medical examination without abnormal findings: Secondary | ICD-10-CM | POA: Diagnosis not present

## 2015-11-24 DIAGNOSIS — M25572 Pain in left ankle and joints of left foot: Secondary | ICD-10-CM | POA: Diagnosis not present

## 2015-11-24 DIAGNOSIS — Z1389 Encounter for screening for other disorder: Secondary | ICD-10-CM | POA: Diagnosis not present

## 2015-11-29 DIAGNOSIS — Z1212 Encounter for screening for malignant neoplasm of rectum: Secondary | ICD-10-CM | POA: Diagnosis not present

## 2015-12-11 DIAGNOSIS — H43813 Vitreous degeneration, bilateral: Secondary | ICD-10-CM | POA: Diagnosis not present

## 2015-12-11 DIAGNOSIS — H2513 Age-related nuclear cataract, bilateral: Secondary | ICD-10-CM | POA: Diagnosis not present

## 2015-12-11 DIAGNOSIS — H33311 Horseshoe tear of retina without detachment, right eye: Secondary | ICD-10-CM | POA: Diagnosis not present

## 2015-12-18 DIAGNOSIS — H35412 Lattice degeneration of retina, left eye: Secondary | ICD-10-CM | POA: Diagnosis not present

## 2015-12-18 DIAGNOSIS — H33322 Round hole, left eye: Secondary | ICD-10-CM | POA: Diagnosis not present

## 2015-12-18 DIAGNOSIS — H31009 Unspecified chorioretinal scars, unspecified eye: Secondary | ICD-10-CM | POA: Diagnosis not present

## 2015-12-18 DIAGNOSIS — H33302 Unspecified retinal break, left eye: Secondary | ICD-10-CM | POA: Diagnosis not present

## 2016-07-01 DIAGNOSIS — D509 Iron deficiency anemia, unspecified: Secondary | ICD-10-CM | POA: Insufficient documentation

## 2016-07-01 DIAGNOSIS — K267 Chronic duodenal ulcer without hemorrhage or perforation: Secondary | ICD-10-CM | POA: Insufficient documentation

## 2016-07-01 HISTORY — PX: ESOPHAGOGASTRODUODENOSCOPY: SHX1529

## 2016-08-19 DIAGNOSIS — Z0000 Encounter for general adult medical examination without abnormal findings: Secondary | ICD-10-CM | POA: Diagnosis not present

## 2016-08-19 DIAGNOSIS — R351 Nocturia: Secondary | ICD-10-CM | POA: Diagnosis not present

## 2016-08-19 DIAGNOSIS — R7309 Other abnormal glucose: Secondary | ICD-10-CM | POA: Diagnosis not present

## 2016-08-19 DIAGNOSIS — M542 Cervicalgia: Secondary | ICD-10-CM | POA: Diagnosis not present

## 2016-08-19 DIAGNOSIS — Z6830 Body mass index (BMI) 30.0-30.9, adult: Secondary | ICD-10-CM | POA: Diagnosis not present

## 2016-08-19 DIAGNOSIS — Z1389 Encounter for screening for other disorder: Secondary | ICD-10-CM | POA: Diagnosis not present

## 2016-08-26 ENCOUNTER — Ambulatory Visit: Payer: Medicare Other | Attending: Internal Medicine

## 2016-08-26 DIAGNOSIS — R293 Abnormal posture: Secondary | ICD-10-CM | POA: Diagnosis not present

## 2016-08-26 DIAGNOSIS — M542 Cervicalgia: Secondary | ICD-10-CM | POA: Insufficient documentation
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
## 2016-08-26 NOTE — Patient Instructions (Addendum)
PERFORM ALL EXERCISES GENTLY AND WITH GOOD POSTURE.    20 SECOND HOLD, 3 REPS TO EACH SIDE. 4-5 TIMES EACH DAY.   AROM: Neck Rotation   Turn head slowly to look over one shoulder, then the other.   AROM: Neck Flexion   Bend head forward.   AROM: Lateral Neck Flexion   Slowly tilt head toward one shoulder, then the other.    Posture - Standing   Good posture is important. Avoid slouching and forward head thrust. Maintain curve in low back and align ears over shoulders, hips over ankles.  Pull your belly button in toward your back bone. Posture Tips DO: - stand tall and erect - keep chin tucked in - keep head and shoulders in alignment - check posture regularly in mirror or large window - pull head back against headrest in car seat;  Change your position often.  Sit with lumbar support. DON'T: - slouch or slump while watching TV or reading - sit, stand or lie in one position  for too long;  Sitting is especially hard on the spine so if you sit at a desk/use the computer, then stand up often! Copyright  VHI. All rights reserved.  Posture - Sitting  Sit upright, head facing forward. Try using a roll to support lower back. Keep shoulders relaxed, and avoid rounded back. Keep hips level with knees. Avoid crossing legs for long periods. Copyright  VHI. All rights reserved.  Chronic neck strain can develop because of poor posture and faulty work habits  Postural strain related to slumped sitting and forward head posture is a leading cause of headaches, neck and upper back pain  General strengthening and flexibility exercises are helpful in the treatment of neck pain.  Most importantly, you should learn to correct the posture that may be contributing to chronic pain.   Change positions frequently  Change your work or home environment to improve posture and mechanics.   [facility] [address], [address] [place], [place] [address] Phone # [phone] Fax  [phone] 

## 2016-11-22 DIAGNOSIS — Z125 Encounter for screening for malignant neoplasm of prostate: Secondary | ICD-10-CM | POA: Diagnosis not present

## 2016-11-22 DIAGNOSIS — R7309 Other abnormal glucose: Secondary | ICD-10-CM | POA: Diagnosis not present

## 2016-11-22 DIAGNOSIS — E784 Other hyperlipidemia: Secondary | ICD-10-CM | POA: Diagnosis not present

## 2016-11-29 DIAGNOSIS — M542 Cervicalgia: Secondary | ICD-10-CM | POA: Diagnosis not present

## 2016-11-29 DIAGNOSIS — M25572 Pain in left ankle and joints of left foot: Secondary | ICD-10-CM | POA: Diagnosis not present

## 2016-11-29 DIAGNOSIS — E784 Other hyperlipidemia: Secondary | ICD-10-CM | POA: Diagnosis not present

## 2016-11-29 DIAGNOSIS — G4709 Other insomnia: Secondary | ICD-10-CM | POA: Diagnosis not present

## 2016-11-29 DIAGNOSIS — M48061 Spinal stenosis, lumbar region without neurogenic claudication: Secondary | ICD-10-CM | POA: Diagnosis not present

## 2016-11-29 DIAGNOSIS — N528 Other male erectile dysfunction: Secondary | ICD-10-CM | POA: Diagnosis not present

## 2016-11-29 DIAGNOSIS — Z6830 Body mass index (BMI) 30.0-30.9, adult: Secondary | ICD-10-CM | POA: Diagnosis not present

## 2016-11-29 DIAGNOSIS — R7309 Other abnormal glucose: Secondary | ICD-10-CM | POA: Diagnosis not present

## 2016-11-29 DIAGNOSIS — F418 Other specified anxiety disorders: Secondary | ICD-10-CM | POA: Diagnosis not present

## 2016-11-29 DIAGNOSIS — Z0000 Encounter for general adult medical examination without abnormal findings: Secondary | ICD-10-CM | POA: Diagnosis not present

## 2016-12-04 DIAGNOSIS — Z1212 Encounter for screening for malignant neoplasm of rectum: Secondary | ICD-10-CM | POA: Diagnosis not present

## 2016-12-16 DIAGNOSIS — H2513 Age-related nuclear cataract, bilateral: Secondary | ICD-10-CM | POA: Diagnosis not present

## 2016-12-16 DIAGNOSIS — H43813 Vitreous degeneration, bilateral: Secondary | ICD-10-CM | POA: Diagnosis not present

## 2016-12-17 ENCOUNTER — Other Ambulatory Visit: Payer: Self-pay | Admitting: Internal Medicine

## 2016-12-17 DIAGNOSIS — M542 Cervicalgia: Secondary | ICD-10-CM

## 2016-12-18 DIAGNOSIS — H43811 Vitreous degeneration, right eye: Secondary | ICD-10-CM | POA: Diagnosis not present

## 2016-12-18 DIAGNOSIS — H43392 Other vitreous opacities, left eye: Secondary | ICD-10-CM | POA: Diagnosis not present

## 2016-12-18 DIAGNOSIS — H2513 Age-related nuclear cataract, bilateral: Secondary | ICD-10-CM | POA: Diagnosis not present

## 2016-12-18 DIAGNOSIS — H33322 Round hole, left eye: Secondary | ICD-10-CM | POA: Diagnosis not present

## 2016-12-27 ENCOUNTER — Ambulatory Visit
Admission: RE | Admit: 2016-12-27 | Discharge: 2016-12-27 | Disposition: A | Discharge status: Home / Self Care | Payer: Medicare Other | Source: Ambulatory Visit | Attending: Internal Medicine | Admitting: Internal Medicine

## 2016-12-27 DIAGNOSIS — M542 Cervicalgia: Secondary | ICD-10-CM

## 2016-12-27 DIAGNOSIS — M50223 Other cervical disc displacement at C6-C7 level: Secondary | ICD-10-CM | POA: Diagnosis not present

## 2016-12-27 MED ORDER — GADOBENATE DIMEGLUMINE 529 MG/ML IV SOLN
18.0000 mL | Freq: Once | INTRAVENOUS | Status: AC | PRN
  Administered 2016-12-27: 18 mL via INTRAVENOUS

## 2017-02-18 ENCOUNTER — Encounter (INDEPENDENT_AMBULATORY_CARE_PROVIDER_SITE_OTHER): Payer: Self-pay | Admitting: Physical Medicine and Rehabilitation

## 2017-02-18 ENCOUNTER — Ambulatory Visit (INDEPENDENT_AMBULATORY_CARE_PROVIDER_SITE_OTHER): Payer: Medicare Other | Admitting: Physical Medicine and Rehabilitation

## 2017-02-18 VITALS — BP 136/80 | HR 66

## 2017-02-18 DIAGNOSIS — G5621 Lesion of ulnar nerve, right upper limb: Secondary | ICD-10-CM | POA: Diagnosis not present

## 2017-02-18 DIAGNOSIS — M5010 Cervical disc disorder with radiculopathy, unspecified cervical region: Secondary | ICD-10-CM

## 2017-02-18 DIAGNOSIS — R202 Paresthesia of skin: Secondary | ICD-10-CM

## 2017-02-18 DIAGNOSIS — M5412 Radiculopathy, cervical region: Secondary | ICD-10-CM | POA: Diagnosis not present

## 2017-02-18 DIAGNOSIS — M542 Cervicalgia: Secondary | ICD-10-CM | POA: Diagnosis not present

## 2017-02-18 NOTE — Progress Notes (Deleted)
Numbness in left third finger and right fifth finger. Cut right fifth finger recently and did not feel it. Had neck pain and could hardly move. Saw Dr. Sharlett Iles and went to PT. No neck pain right now.
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
## 2017-02-25 ENCOUNTER — Telehealth (INDEPENDENT_AMBULATORY_CARE_PROVIDER_SITE_OTHER): Payer: Self-pay | Admitting: Physical Medicine and Rehabilitation

## 2017-02-25 DIAGNOSIS — M48061 Spinal stenosis, lumbar region without neurogenic claudication: Secondary | ICD-10-CM | POA: Diagnosis not present

## 2017-02-25 DIAGNOSIS — R7309 Other abnormal glucose: Secondary | ICD-10-CM | POA: Diagnosis not present

## 2017-02-25 DIAGNOSIS — M542 Cervicalgia: Secondary | ICD-10-CM | POA: Diagnosis not present

## 2017-02-25 NOTE — Telephone Encounter (Signed)
Patient came in wanting to make an appointment to do a nerve study. He said Dr. Ernestina Patches suggested it he thinks. CB # [phone]

## 2017-02-25 NOTE — Telephone Encounter (Signed)
Pt scheduled for 03/12/17 @ 10:30

## 2017-02-25 NOTE — Telephone Encounter (Signed)
Please advise 

## 2017-02-25 NOTE — Telephone Encounter (Signed)
Yes right UE for severe CTS vs radic.

## 2017-03-12 ENCOUNTER — Ambulatory Visit (INDEPENDENT_AMBULATORY_CARE_PROVIDER_SITE_OTHER): Payer: Medicare Other | Admitting: Physical Medicine and Rehabilitation

## 2017-03-12 ENCOUNTER — Encounter (INDEPENDENT_AMBULATORY_CARE_PROVIDER_SITE_OTHER): Payer: Self-pay | Admitting: Physical Medicine and Rehabilitation

## 2017-03-12 DIAGNOSIS — R202 Paresthesia of skin: Secondary | ICD-10-CM

## 2017-03-12 NOTE — Progress Notes (Deleted)
Here for planned RUE. Right hand dominant. Numbness and tingling from elbow to fourth and fifth fingers on the right and in third finger on the left.

## 2017-03-18 ENCOUNTER — Encounter (INDEPENDENT_AMBULATORY_CARE_PROVIDER_SITE_OTHER): Payer: Self-pay | Admitting: Orthopaedic Surgery

## 2017-03-18 ENCOUNTER — Ambulatory Visit (INDEPENDENT_AMBULATORY_CARE_PROVIDER_SITE_OTHER): Payer: Medicare Other | Admitting: Orthopaedic Surgery

## 2017-03-18 DIAGNOSIS — G5621 Lesion of ulnar nerve, right upper limb: Secondary | ICD-10-CM

## 2017-03-18 DIAGNOSIS — G5602 Carpal tunnel syndrome, left upper limb: Secondary | ICD-10-CM

## 2017-03-18 NOTE — Procedures (Signed)
EMG & NCV Findings: Evaluation of the left median motor nerve showed prolonged distal onset latency (4.7 ms), reduced amplitude (1.9 mV), and decreased conduction velocity (Elbow-Wrist, 46 m/s).  The right ulnar motor nerve showed reduced amplitude (0.6 mV), decreased conduction velocity (B Elbow-Wrist, 25 m/s), and decreased conduction velocity (A Elbow-B Elbow, 21 m/s).  The left median (across palm) sensory and the right median (across palm) sensory nerves showed prolonged distal peak latency (Wrist, L11.5, R4.7 ms) and prolonged distal peak latency (Palm, L4.5, R2.3 ms).  The right ulnar sensory nerve showed prolonged distal peak latency (8.8 ms), reduced amplitude (5.8 V), and decreased conduction velocity (Wrist-5th Digit, 16 m/s).  All remaining nerves (as indicated in the following tables) were within normal limits.  Left vs. Right side comparison data for the median motor nerve indicates abnormal L-R amplitude difference (64.8 %).    Needle evaluation of the right first dorsal interosseous muscle showed decreased insertional activity, increased spontaneous activity, and diminished recruitment.  The right flexor digitorum profundus muscle showed increased spontaneous activity, increased motor unit amplitude, and diminished recruitment.  The right flexor carpi ulnaris muscle showed increased spontaneous activity and diminished recruitment.  All remaining muscles (as indicated in the following table) showed no evidence of electrical instability.    Impression: The above electrodiagnostic study is ABNORMAL and reveals evidence of:  1. 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 The lesion is characterized by sensory and motor demyelination with significant evidence of axonal injury. *Even with appropriate decompressive therapy he likely will have some residual symptoms.  2. 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  Has no significant electrodiagnostic evidence of cervical radiculopathy but I cannot rule out concomitant mild sensory polyneuropathy.  As you know, this particular electrodiagnostic study cannot rule out chemical radiculitis or sensory only radiculopathy.   Recommendations: 1.  Follow-up with referring physician. 2.  Continue current management of symptoms. 3.  Suggest surgical evaluation.    Nerve Conduction Studies Anti Sensory Summary Table   Stim Site NR Peak (ms) Norm Peak (ms) P-T Amp (V) Norm P-T Amp Site1 Site2 Delta-P (ms) Dist (cm) Vel (m/s) Norm Vel (m/s)  Left Median Acr Palm Anti Sensory (2nd Digit)  33.4C  Wrist    *11.5 <3.6 12.6 >10 Wrist Palm 7.0 0.0    Palm    *4.5 <2.0 10.4         Right Median Acr Palm Anti Sensory (2nd Digit)  33C  Wrist    *4.7 <3.6 11.5 >10 Wrist Palm 2.4 0.0    Palm    *2.3 <2.0 4.9         Right Radial Anti Sensory (Base 1st Digit)  33.9C  Wrist    2.0 <3.1 23.8  Wrist Base 1st Digit 2.0 0.0    Right Ulnar Anti Sensory (5th Digit)  33.8C  Wrist    *8.8 <3.7 *5.8 >15.0 Wrist 5th Digit 8.8 14.0 *16 >38  B Elbow    7.6  3.0  B Elbow Wrist 1.2 0.0  >47   Motor Summary Table   Stim Site NR Onset (ms) Norm Onset (ms) O-P Amp (mV) Norm O-P Amp Site1 Site2 Delta-0 (ms) Dist (cm) Vel (m/s) Norm Vel (m/s)  Left Median Motor (Abd Poll Brev)  33.5C  Wrist    *4.7 <4.2 *1.9 >5 Elbow Wrist 5.1 23.5 *46 >50  Elbow  9.8  1.8         Right Median Motor (Abd Poll Brev)  34C  Wrist    4.1 <4.2 5.4 >5 Elbow Wrist 4.1 21.5 52 >50  Elbow    8.2  2.7         Right Ulnar Motor (Abd Dig Min)  34C  Wrist    3.2 <4.2 *0.6 >3 B Elbow Wrist 9.1 22.5 *25 >53  B Elbow    12.3  0.9  A Elbow B Elbow 4.7 10.0 *21 >53  A Elbow    17.0  0.8          EMG   Side Muscle Nerve Root Ins Act Fibs Psw Amp Dur Poly Recrt Int Fraser Din Comment  Right Abd Poll Brev Median C8-T1 Nml Nml Nml Nml Nml 0 Nml  Nml   Right 1stDorInt Ulnar C8-T1 *Decr *3+ *3+ Nml Nml 0 *Reduced Nml   Right PronatorTeres Median C6-7 Nml Nml Nml Nml Nml 0 Nml Nml   Right Biceps Musculocut C5-6 Nml Nml Nml Nml Nml 0 Nml Nml   Right Deltoid Axillary C5-6 Nml Nml Nml Nml Nml 0 Nml Nml   Right FlexDigProf Ulnar C8,T1 Nml *3+ *3+ *Incr Nml 0 *Reduced Nml   Right FlexCarpiUln Ulnar C8,T1 Nml *3+ *3+ Nml Nml 0 *Reduced Nml     Nerve Conduction Studies Anti Sensory Left/Right Comparison   Stim Site L Lat (ms) R Lat (ms) L-R Lat (ms) L Amp (V) R Amp (V) L-R Amp (%) Site1 Site2 L Vel (m/s) R Vel (m/s) L-R Vel (m/s)  Median Acr Palm Anti Sensory (2nd Digit)  33.4C  Wrist *11.5 *4.7 6.8 12.6 11.5 8.7 Wrist Palm     Palm *4.5 *2.3 2.2 10.4 4.9 52.9       Radial Anti Sensory (Base 1st Digit)  33.9C  Wrist  2.0   23.8  Wrist Base 1st Digit     Ulnar Anti Sensory (5th Digit)  33.8C  Wrist  *8.8   *5.8  Wrist 5th Digit  *16   B Elbow  7.6   3.0  B Elbow Wrist      Motor Left/Right Comparison   Stim Site L Lat (ms) R Lat (ms) L-R Lat (ms) L Amp (mV) R Amp (mV) L-R Amp (%) Site1 Site2 L Vel (m/s) R Vel (m/s) L-R Vel (m/s)  Median Motor (Abd Poll Brev)  33.5C  Wrist *4.7 4.1 0.6 *1.9 5.4 *64.8 Elbow Wrist *46 52 6  Elbow 9.8 8.2 1.6 1.8 2.7 33.3       Ulnar Motor (Abd Dig Min)  34C  Wrist  3.2   *0.6  B Elbow Wrist  *25   B Elbow  12.3   0.9  A Elbow B Elbow  *21   A Elbow  17.0   0.8           Waveforms:
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
## 2017-05-29 DIAGNOSIS — Z6830 Body mass index (BMI) 30.0-30.9, adult: Secondary | ICD-10-CM | POA: Diagnosis not present

## 2017-05-29 DIAGNOSIS — R7309 Other abnormal glucose: Secondary | ICD-10-CM | POA: Diagnosis not present

## 2017-05-29 DIAGNOSIS — M79605 Pain in left leg: Secondary | ICD-10-CM | POA: Diagnosis not present

## 2017-05-29 DIAGNOSIS — M25572 Pain in left ankle and joints of left foot: Secondary | ICD-10-CM | POA: Diagnosis not present

## 2017-05-29 DIAGNOSIS — M79604 Pain in right leg: Secondary | ICD-10-CM | POA: Diagnosis not present

## 2017-05-29 DIAGNOSIS — M48061 Spinal stenosis, lumbar region without neurogenic claudication: Secondary | ICD-10-CM | POA: Diagnosis not present

## 2017-08-29 DIAGNOSIS — Z1389 Encounter for screening for other disorder: Secondary | ICD-10-CM | POA: Diagnosis not present

## 2017-08-29 DIAGNOSIS — R7309 Other abnormal glucose: Secondary | ICD-10-CM | POA: Diagnosis not present

## 2017-08-29 DIAGNOSIS — M25572 Pain in left ankle and joints of left foot: Secondary | ICD-10-CM | POA: Diagnosis not present

## 2017-08-29 DIAGNOSIS — M79605 Pain in left leg: Secondary | ICD-10-CM | POA: Diagnosis not present

## 2017-08-29 DIAGNOSIS — M48061 Spinal stenosis, lumbar region without neurogenic claudication: Secondary | ICD-10-CM | POA: Diagnosis not present

## 2017-08-29 DIAGNOSIS — M79604 Pain in right leg: Secondary | ICD-10-CM | POA: Diagnosis not present

## 2017-08-29 DIAGNOSIS — Z6831 Body mass index (BMI) 31.0-31.9, adult: Secondary | ICD-10-CM | POA: Diagnosis not present

## 2017-09-02 ENCOUNTER — Telehealth (INDEPENDENT_AMBULATORY_CARE_PROVIDER_SITE_OTHER): Payer: Self-pay | Admitting: Orthopaedic Surgery

## 2017-09-02 NOTE — Telephone Encounter (Signed)
See message below °

## 2017-09-02 NOTE — Telephone Encounter (Signed)
Let's have him come in for an appt

## 2017-09-02 NOTE — Telephone Encounter (Signed)
Patient states he's ready to have carpal tunnel surgery, and wasn't sure if he needed to be seen again or if the surgery could just be scheduled. Please call patient to advise.

## 2017-09-03 DIAGNOSIS — H25813 Combined forms of age-related cataract, bilateral: Secondary | ICD-10-CM | POA: Diagnosis not present

## 2017-09-03 DIAGNOSIS — H35412 Lattice degeneration of retina, left eye: Secondary | ICD-10-CM | POA: Diagnosis not present

## 2017-09-03 DIAGNOSIS — H33311 Horseshoe tear of retina without detachment, right eye: Secondary | ICD-10-CM | POA: Diagnosis not present

## 2017-09-03 NOTE — Telephone Encounter (Signed)
Made appt

## 2017-09-09 ENCOUNTER — Ambulatory Visit (INDEPENDENT_AMBULATORY_CARE_PROVIDER_SITE_OTHER): Payer: Medicare Other | Admitting: Orthopaedic Surgery

## 2017-09-09 ENCOUNTER — Encounter (INDEPENDENT_AMBULATORY_CARE_PROVIDER_SITE_OTHER): Payer: Self-pay | Admitting: Orthopaedic Surgery

## 2017-09-09 DIAGNOSIS — G5621 Lesion of ulnar nerve, right upper limb: Secondary | ICD-10-CM

## 2017-09-09 DIAGNOSIS — G5602 Carpal tunnel syndrome, left upper limb: Secondary | ICD-10-CM | POA: Diagnosis not present
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
## 2017-09-15 DIAGNOSIS — H2512 Age-related nuclear cataract, left eye: Secondary | ICD-10-CM | POA: Diagnosis not present

## 2017-09-15 DIAGNOSIS — H25812 Combined forms of age-related cataract, left eye: Secondary | ICD-10-CM | POA: Diagnosis not present

## 2017-09-25 ENCOUNTER — Telehealth (INDEPENDENT_AMBULATORY_CARE_PROVIDER_SITE_OTHER): Payer: Self-pay | Admitting: Orthopaedic Surgery

## 2017-09-25 NOTE — Telephone Encounter (Signed)
How long is the recovery for cataract surgery? He should ask his MD

## 2017-09-25 NOTE — Telephone Encounter (Signed)
Patient called in and stated that he is having cataract surgery on 4/1 and surgery scheduled on 4/5. Patient wanted to know if this would interfere with the surgery on 4/5.  Please call patient to advise

## 2017-09-25 NOTE — Telephone Encounter (Signed)
Please advise 

## 2017-09-26 DIAGNOSIS — H2511 Age-related nuclear cataract, right eye: Secondary | ICD-10-CM | POA: Diagnosis not present

## 2017-09-29 DIAGNOSIS — H25811 Combined forms of age-related cataract, right eye: Secondary | ICD-10-CM | POA: Diagnosis not present

## 2017-09-29 DIAGNOSIS — H2511 Age-related nuclear cataract, right eye: Secondary | ICD-10-CM | POA: Diagnosis not present

## 2017-10-03 DIAGNOSIS — G5621 Lesion of ulnar nerve, right upper limb: Secondary | ICD-10-CM | POA: Diagnosis not present

## 2017-10-13 ENCOUNTER — Telehealth (INDEPENDENT_AMBULATORY_CARE_PROVIDER_SITE_OTHER): Payer: Self-pay | Admitting: Orthopaedic Surgery

## 2017-10-13 ENCOUNTER — Encounter (INDEPENDENT_AMBULATORY_CARE_PROVIDER_SITE_OTHER): Payer: Self-pay | Admitting: Orthopaedic Surgery

## 2017-10-13 ENCOUNTER — Ambulatory Visit (INDEPENDENT_AMBULATORY_CARE_PROVIDER_SITE_OTHER): Payer: Medicare Other | Admitting: Orthopaedic Surgery

## 2017-10-13 DIAGNOSIS — G5621 Lesion of ulnar nerve, right upper limb: Secondary | ICD-10-CM

## 2017-10-13 NOTE — Telephone Encounter (Signed)
Sam with [facility] called advised patient came in with a piece of paper for rehab. Sam said she need a signed Rx from Dr Erlinda Hong. The fax# [phone]  The ph# is [phone]
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
## 2017-10-15 NOTE — Telephone Encounter (Signed)
[facility] called again to let us know they need the signed prescription for OT before they can see the patient and he is scheduled for an appt tomorrow. Please fax back to [phone]

## 2017-10-16 DIAGNOSIS — M25541 Pain in joints of right hand: Secondary | ICD-10-CM | POA: Diagnosis not present

## 2017-10-16 DIAGNOSIS — M6281 Muscle weakness (generalized): Secondary | ICD-10-CM | POA: Diagnosis not present

## 2017-10-16 DIAGNOSIS — M25531 Pain in right wrist: Secondary | ICD-10-CM | POA: Diagnosis not present

## 2017-10-16 DIAGNOSIS — M25521 Pain in right elbow: Secondary | ICD-10-CM | POA: Diagnosis not present

## 2017-10-16 NOTE — Telephone Encounter (Signed)
FAXED

## 2017-10-22 DIAGNOSIS — M6281 Muscle weakness (generalized): Secondary | ICD-10-CM | POA: Diagnosis not present

## 2017-10-22 DIAGNOSIS — M25541 Pain in joints of right hand: Secondary | ICD-10-CM | POA: Diagnosis not present

## 2017-10-22 DIAGNOSIS — M25531 Pain in right wrist: Secondary | ICD-10-CM | POA: Diagnosis not present

## 2017-10-22 DIAGNOSIS — M25521 Pain in right elbow: Secondary | ICD-10-CM | POA: Diagnosis not present

## 2017-11-27 ENCOUNTER — Encounter (INDEPENDENT_AMBULATORY_CARE_PROVIDER_SITE_OTHER): Payer: Self-pay | Admitting: Orthopaedic Surgery

## 2017-11-27 ENCOUNTER — Ambulatory Visit (INDEPENDENT_AMBULATORY_CARE_PROVIDER_SITE_OTHER): Payer: Medicare Other | Admitting: Orthopaedic Surgery

## 2017-11-27 DIAGNOSIS — R82998 Other abnormal findings in urine: Secondary | ICD-10-CM | POA: Diagnosis not present

## 2017-11-27 DIAGNOSIS — G5621 Lesion of ulnar nerve, right upper limb: Secondary | ICD-10-CM

## 2017-11-27 DIAGNOSIS — Z125 Encounter for screening for malignant neoplasm of prostate: Secondary | ICD-10-CM | POA: Diagnosis not present

## 2017-11-27 DIAGNOSIS — E7849 Other hyperlipidemia: Secondary | ICD-10-CM | POA: Diagnosis not present

## 2017-11-27 DIAGNOSIS — R7309 Other abnormal glucose: Secondary | ICD-10-CM | POA: Diagnosis not present

## 2017-11-27 NOTE — Progress Notes (Signed)
Patient is almost 8 weeks status post right cubital tunnel release.  Overall he is doing well.  He does have some persistent numbness.  His surgical scar is fully healed.  He is gotten all of his elbow range of motion back.  He is currently working on hand therapy to strengthen his interossei muscles.  We discussed that the numbness is common and can take up to year to improve.  Patient looks good from my standpoint.  Follow-up as needed.

## 2017-12-02 DIAGNOSIS — M25572 Pain in left ankle and joints of left foot: Secondary | ICD-10-CM | POA: Diagnosis not present

## 2017-12-02 DIAGNOSIS — R7309 Other abnormal glucose: Secondary | ICD-10-CM | POA: Diagnosis not present

## 2017-12-02 DIAGNOSIS — G4709 Other insomnia: Secondary | ICD-10-CM | POA: Diagnosis not present

## 2017-12-02 DIAGNOSIS — Z0000 Encounter for general adult medical examination without abnormal findings: Secondary | ICD-10-CM | POA: Diagnosis not present

## 2017-12-02 DIAGNOSIS — M545 Low back pain: Secondary | ICD-10-CM | POA: Diagnosis not present

## 2017-12-02 DIAGNOSIS — E7849 Other hyperlipidemia: Secondary | ICD-10-CM | POA: Diagnosis not present

## 2017-12-02 DIAGNOSIS — M542 Cervicalgia: Secondary | ICD-10-CM | POA: Diagnosis not present

## 2017-12-02 DIAGNOSIS — Z6830 Body mass index (BMI) 30.0-30.9, adult: Secondary | ICD-10-CM | POA: Diagnosis not present

## 2017-12-02 DIAGNOSIS — Z1212 Encounter for screening for malignant neoplasm of rectum: Secondary | ICD-10-CM | POA: Diagnosis not present

## 2017-12-02 DIAGNOSIS — Z1389 Encounter for screening for other disorder: Secondary | ICD-10-CM | POA: Diagnosis not present

## 2018-02-27 DIAGNOSIS — M25562 Pain in left knee: Secondary | ICD-10-CM | POA: Diagnosis not present

## 2018-02-27 DIAGNOSIS — Z6831 Body mass index (BMI) 31.0-31.9, adult: Secondary | ICD-10-CM | POA: Diagnosis not present

## 2018-02-27 DIAGNOSIS — M25572 Pain in left ankle and joints of left foot: Secondary | ICD-10-CM | POA: Diagnosis not present

## 2018-02-27 DIAGNOSIS — M25552 Pain in left hip: Secondary | ICD-10-CM | POA: Diagnosis not present

## 2018-02-27 DIAGNOSIS — R7309 Other abnormal glucose: Secondary | ICD-10-CM | POA: Diagnosis not present

## 2018-04-22 ENCOUNTER — Ambulatory Visit (INDEPENDENT_AMBULATORY_CARE_PROVIDER_SITE_OTHER): Payer: Medicare Other | Admitting: Orthopaedic Surgery

## 2018-04-22 ENCOUNTER — Ambulatory Visit (INDEPENDENT_AMBULATORY_CARE_PROVIDER_SITE_OTHER): Payer: Medicare Other

## 2018-04-22 ENCOUNTER — Encounter (INDEPENDENT_AMBULATORY_CARE_PROVIDER_SITE_OTHER): Payer: Self-pay | Admitting: Orthopaedic Surgery

## 2018-04-22 DIAGNOSIS — M25552 Pain in left hip: Secondary | ICD-10-CM

## 2018-04-22 DIAGNOSIS — G8929 Other chronic pain: Secondary | ICD-10-CM | POA: Diagnosis not present

## 2018-04-22 DIAGNOSIS — M25562 Pain in left knee: Secondary | ICD-10-CM

## 2018-04-22 DIAGNOSIS — M1612 Unilateral primary osteoarthritis, left hip: Secondary | ICD-10-CM
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
## 2018-04-23 ENCOUNTER — Ambulatory Visit (INDEPENDENT_AMBULATORY_CARE_PROVIDER_SITE_OTHER): Payer: Self-pay

## 2018-04-23 ENCOUNTER — Ambulatory Visit (INDEPENDENT_AMBULATORY_CARE_PROVIDER_SITE_OTHER): Payer: Medicare Other | Admitting: Physical Medicine and Rehabilitation

## 2018-04-23 ENCOUNTER — Encounter (INDEPENDENT_AMBULATORY_CARE_PROVIDER_SITE_OTHER): Payer: Self-pay | Admitting: Physical Medicine and Rehabilitation

## 2018-04-23 DIAGNOSIS — M25552 Pain in left hip: Secondary | ICD-10-CM

## 2018-04-23 NOTE — Progress Notes (Signed)
Allen Taylor - 72 y.o. male MRN [id number]  Date of birth: March 22, 1946  Office Visit Note: Visit Date: 04/23/2018 PCP: Leanna Battles, MD Referred by: Leanna Battles, MD  Subjective: Chief Complaint  Patient presents with  . Left Hip - Pain   HPI:  Allen Taylor is a 72 y.o. male who comes in today At the request of Dr. Jean Rosenthal for left intra-articular anesthetic hip arthrogram.  Patient has right below the knee amputation.  I have seen the patient few times in the past for back pain and stump pain as well as hand pain.  He is on chronic opioid management.  He does have pain in the left hip and groin pain consistent with hip etiology.  Pain worsening with stepping up and moving the hip and leg and gets better with sitting and at rest.  ROS Otherwise per HPI.  Assessment & Plan: Visit Diagnoses:  1. Pain in left hip     Plan: Findings:  Diagnostic note for therapeutic left hip anesthetic hip arthrogram.  Patient did have some relief during the anesthetic phase.    Meds & Orders: No orders of the defined types were placed in this encounter.   Orders Placed This Encounter  Procedures  . Large Joint Inj: L hip joint  . XR C-ARM NO REPORT    Follow-up: Return if symptoms worsen or fail to improve.   Procedures: Large Joint Inj: L hip joint on 04/23/2018 3:02 PM Indications: pain and diagnostic evaluation Details: 22 G needle, anterior approach  Arthrogram: Yes  Medications: 80 mg triamcinolone acetonide 40 MG/ML; 3 mL bupivacaine 0.5 % Outcome: tolerated well, no immediate complications  Arthrogram demonstrated excellent flow of contrast throughout the joint surface without extravasation or obvious defect.  The patient had relief of symptoms during the anesthetic phase of the injection.  Procedure, treatment alternatives, risks and benefits explained, specific risks discussed. Consent was given by the patient. Immediately prior to procedure a time out  was called to verify the correct patient, procedure, equipment, support staff and site/side marked as required. Patient was prepped and draped in the usual sterile fashion.      No notes on file   Clinical History: MRI CERVICAL SPINE WITHOUT AND WITH CONTRAST  TECHNIQUE: Multiplanar and multiecho pulse sequences of the cervical spine, to include the craniocervical junction and cervicothoracic junction, were obtained without and with intravenous contrast.  CONTRAST:  8mL MULTIHANCE GADOBENATE DIMEGLUMINE 529 MG/ML IV SOLN  COMPARISON:  None.  FINDINGS: Alignment: Grade 1 anterolisthesis C5-6 and grade 1 retrolisthesis at C6-7.  Vertebrae: No fracture, evidence of discitis, or bone lesion.  Cord: Normal signal and morphology.  Posterior Fossa, vertebral arteries, paraspinal tissues: Negative.  Disc levels:  C1-C2: Normal.  C2-C3: Small disc bulge with moderate bilateral neural foraminal stenosis. No spinal canal stenosis.  C3-C4: Right-greater-than-left uncovertebral hypertrophy and facet hypertrophy with moderate narrowing of the right neural foramen. No spinal canal or left foraminal stenosis.  C4-C5: Disc bulge and left-greater-than-right facet hypertrophy with mild uncovertebral spurring. Mild left neural foraminal stenosis.  C5-C6: Bilateral facet hypertrophy and small disc osteophyte complex. Mild left foraminal stenosis. No spinal canal stenosis.  C6-C7: Medium-sized central disc protrusion indents the ventral aspect of the spinal cord. No spinal canal stenosis. Moderate left neural foraminal stenosis.  C7-T1: Normal disc space and facets. No spinal canal or neuroforaminal stenosis.  No abnormal contrast enhancement.  IMPRESSION: 1. Central disc protrusion at C6-C7 indents the ventral spinal cord without  causing central spinal canal stenosis. Moderate left foraminal stenosis also at this level. 2. Moderate right C3-C4 neural foraminal  stenosis due to combination of uncovertebral and facet hypertrophy. 3. Mild left foraminal stenosis at C4-5 and C5-6.   Electronically Signed   By: Ulyses Jarred M.D.   On: 12/27/2016 16:05     Objective:  VS:  HT:    WT:   BMI:     BP:   HR: bpm  TEMP: ( )  RESP:  Physical Exam  Ortho Exam Imaging: No results found.

## 2018-04-23 NOTE — Progress Notes (Signed)
 .  Numeric Pain Rating Scale and Functional Assessment Average Pain 7   In the last MONTH (on 0-10 scale) has pain interfered with the following?  1. General activity like being  able to carry out your everyday physical activities such as walking, climbing stairs, carrying groceries, or moving a chair?  Rating(5)  -Dye Allergies.  

## 2018-04-23 NOTE — Patient Instructions (Signed)

## 2018-04-30 MED ORDER — TRIAMCINOLONE ACETONIDE 40 MG/ML IJ SUSP
80.0000 mg | INTRAMUSCULAR | Status: AC | PRN
  Administered 2018-04-23: 80 mg via INTRA_ARTICULAR

## 2018-04-30 MED ORDER — BUPIVACAINE HCL 0.5 % IJ SOLN
3.0000 mL | INTRAMUSCULAR | Status: AC | PRN
  Administered 2018-04-23: 3 mL via INTRA_ARTICULAR

## 2018-05-20 ENCOUNTER — Encounter (INDEPENDENT_AMBULATORY_CARE_PROVIDER_SITE_OTHER): Payer: Self-pay | Admitting: Orthopaedic Surgery

## 2018-05-20 ENCOUNTER — Ambulatory Visit (INDEPENDENT_AMBULATORY_CARE_PROVIDER_SITE_OTHER): Payer: Medicare Other | Admitting: Orthopaedic Surgery

## 2018-05-20 DIAGNOSIS — M1612 Unilateral primary osteoarthritis, left hip: Secondary | ICD-10-CM | POA: Diagnosis not present

## 2018-05-20 NOTE — Progress Notes (Signed)
The patient is following up with known severe end-stage arthritis in his left hip.  This is been diagnosed with plain films and clinical exam.  We did send him for an intra-articular steroid injection in his left hip and he said that worked wonderful for about a week or 2 now is beginning to wear off.  He has pain in his groin.  At this point his left hip pain is detrimentally affected his activity living, his quality of life and his mobility.  He is someone with a below-knee amputation on the opposite side and does wear a prosthesis.  This does throw his gait off.  He also has significant lumbar spine issues.  His wife is with him today.  He would like to talk again about total hip arthroplasty surgery.  We have discussed this with him in the past.  At this point his left hip pain is affecting his mobility detrimentally.  Is also mainly affecting his activity living and his quality of life.  On exam again internal extra rotation of his left hip causes significant pain in the groin area.  I did go over his x-rays once again showing the severe arthritis in his left hip joint space narrowing as well as sclerotic and cystic changes in the femoral head and large particular osteophytes.  I am encouraged by the fact that the steroid injection did work although it was temporary in his left hip.  At this point he does wish to proceed with total hip arthroplasty because he wants the same relief that he had from that injection the last long-term and we recommend this as well.  We went over hip replacement surgery in detail with him and his wife.  I showed him a hip model explained to her thorough discussion with the intraoperative and postoperative course would be.  We talked about the risk of the surgery as well.  He does wish to have this set up potentially in January.  All question concerns were answered and addressed.  We will see him at the time of surgery.

## 2018-06-04 DIAGNOSIS — M25552 Pain in left hip: Secondary | ICD-10-CM | POA: Diagnosis not present

## 2018-06-04 DIAGNOSIS — Z23 Encounter for immunization: Secondary | ICD-10-CM | POA: Diagnosis not present

## 2018-06-04 DIAGNOSIS — R030 Elevated blood-pressure reading, without diagnosis of hypertension: Secondary | ICD-10-CM | POA: Diagnosis not present

## 2018-06-04 DIAGNOSIS — R7309 Other abnormal glucose: Secondary | ICD-10-CM | POA: Diagnosis not present

## 2018-06-04 DIAGNOSIS — Z6831 Body mass index (BMI) 31.0-31.9, adult: Secondary | ICD-10-CM | POA: Diagnosis not present

## 2018-06-04 DIAGNOSIS — M545 Low back pain: Secondary | ICD-10-CM | POA: Diagnosis not present

## 2018-07-08 ENCOUNTER — Other Ambulatory Visit (INDEPENDENT_AMBULATORY_CARE_PROVIDER_SITE_OTHER): Payer: Self-pay | Admitting: Physician Assistant

## 2018-07-08 NOTE — Patient Instructions (Addendum)
Allen Taylor  07/08/2018   Your procedure is scheduled on: 07-17-2018    Report to [facility] Main  Entrance      Report to admitting at 7:15AM    Call this number if you have problems the morning of surgery [phone]      Remember: Do not eat food or drink liquids :After Midnight. BRUSH YOUR TEETH MORNING OF SURGERY AND RINSE YOUR MOUTH OUT, NO CHEWING GUM CANDY OR MINTS.     Take these medicines the morning of surgery with A SIP OF WATER: colestipol (colestid), hydrocodone if needed                                 You may not have any metal on your body including hair pins and              piercings  Do not wear jewelry, make-up, lotions, powders or perfumes, deodorant                          Men may shave face and neck.   Do not bring valuables to the hospital. [place].  Contacts, dentures or bridgework may not be worn into surgery.  Leave suitcase in the car. After surgery it may be brought to your room.                  Please read over the following fact sheets you were given: _____________________________________________________________________             [facility] - Preparing for Surgery Before surgery, you can play an important role.  Because skin is not sterile, your skin needs to be as free of germs as possible.  You can reduce the number of germs on your skin by washing with CHG (chlorahexidine gluconate) soap before surgery.  CHG is an antiseptic cleaner which kills germs and bonds with the skin to continue killing germs even after washing. Please DO NOT use if you have an allergy to CHG or antibacterial soaps.  If your skin becomes reddened/irritated stop using the CHG and inform your nurse when you arrive at Short Stay. Do not shave (including legs and underarms) for at least 48 hours prior to the first CHG shower.  You may shave your face/neck. Please follow these  instructions carefully:  1.  Shower with CHG Soap the night before surgery and the  morning of Surgery.  2.  If you choose to wash your hair, wash your hair first as usual with your  normal  shampoo.  3.  After you shampoo, rinse your hair and body thoroughly to remove the  shampoo.                           4.  Use CHG as you would any other liquid soap.  You can apply chg directly  to the skin and wash                       Gently with a scrungie or clean washcloth.  5.  Apply the CHG Soap to your body ONLY FROM THE NECK DOWN.  Do not use on face/ open                           Wound or open sores. Avoid contact with eyes, ears mouth and genitals (private parts).                       Wash face,  Genitals (private parts) with your normal soap.             6.  Wash thoroughly, paying special attention to the area where your surgery  will be performed.  7.  Thoroughly rinse your body with warm water from the neck down.  8.  DO NOT shower/wash with your normal soap after using and rinsing off  the CHG Soap.                9.  Pat yourself dry with a clean towel.            10.  Wear clean pajamas.            11.  Place clean sheets on your bed the night of your first shower and do not  sleep with pets. Day of Surgery : Do not apply any lotions/deodorants the morning of surgery.  Please wear clean clothes to the hospital/surgery center.  FAILURE TO FOLLOW THESE INSTRUCTIONS MAY RESULT IN THE CANCELLATION OF YOUR SURGERY PATIENT SIGNATURE_________________________________  NURSE SIGNATURE__________________________________  ________________________________________________________________________   Allen Taylor  An incentive spirometer is a tool that can help keep your lungs clear and active. This tool measures how well you are filling your lungs with each breath. Taking long deep breaths may help reverse or decrease the chance of developing breathing (pulmonary) problems (especially  infection) following:  A long period of time when you are unable to move or be active. BEFORE THE PROCEDURE   If the spirometer includes an indicator to show your best effort, your nurse or respiratory therapist will set it to a desired goal.  If possible, sit up straight or lean slightly forward. Try not to slouch.  Hold the incentive spirometer in an upright position. INSTRUCTIONS FOR USE  1. Sit on the edge of your bed if possible, or sit up as far as you can in bed or on a chair. 2. Hold the incentive spirometer in an upright position. 3. Breathe out normally. 4. Place the mouthpiece in your mouth and seal your lips tightly around it. 5. Breathe in slowly and as deeply as possible, raising the piston or the ball toward the top of the column. 6. Hold your breath for 3-5 seconds or for as long as possible. Allow the piston or ball to fall to the bottom of the column. 7. Remove the mouthpiece from your mouth and breathe out normally. 8. Rest for a few seconds and repeat Steps 1 through 7 at least 10 times every 1-2 hours when you are awake. Take your time and take a few normal breaths between deep breaths. 9. The spirometer may include an indicator to show your best effort. Use the indicator as a goal to work toward during each repetition. 10. After each set of 10 deep breaths, practice coughing to be sure your lungs are clear. If you have an incision (the cut made at the time of surgery), support your incision when coughing by placing a pillow or rolled up towels firmly against it. Once you are able to get out of  bed, walk around indoors and cough well. You may stop using the incentive spirometer when instructed by your caregiver.  RISKS AND COMPLICATIONS  Take your time so you do not get dizzy or light-headed.  If you are in pain, you may need to take or ask for pain medication before doing incentive spirometry. It is harder to take a deep breath if you are having pain. AFTER  USE  Rest and breathe slowly and easily.  It can be helpful to keep track of a log of your progress. Your caregiver can provide you with a simple table to help with this. If you are using the spirometer at home, follow these instructions: Allen Taylor IF:   You are having difficultly using the spirometer.  You have trouble using the spirometer as often as instructed.  Your pain medication is not giving enough relief while using the spirometer.  You develop fever of 100.5 F (38.1 C) or higher. SEEK IMMEDIATE MEDICAL CARE IF:   You cough up bloody sputum that had not been present before.  You develop fever of 102 F (38.9 C) or greater.  You develop worsening pain at or near the incision site. MAKE SURE YOU:   Understand these instructions.  Will watch your condition.  Will get help right away if you are not doing well or get worse. Document Released: 10/28/2006 Document Revised: 09/09/2011 Document Reviewed: 12/29/2006 [facility] Patient Information 2014 [place], [place].   ________________________________________________________________________

## 2018-07-09 ENCOUNTER — Other Ambulatory Visit: Payer: Self-pay

## 2018-07-09 ENCOUNTER — Encounter (HOSPITAL_COMMUNITY): Payer: Self-pay

## 2018-07-09 ENCOUNTER — Encounter (HOSPITAL_COMMUNITY)
Admission: RE | Admit: 2018-07-09 | Discharge: 2018-07-09 | Disposition: A | Discharge status: Home / Self Care | Payer: Medicare Other | Source: Ambulatory Visit | Attending: Orthopaedic Surgery | Admitting: Orthopaedic Surgery

## 2018-07-09 DIAGNOSIS — Z01812 Encounter for preprocedural laboratory examination: Secondary | ICD-10-CM | POA: Diagnosis not present

## 2018-07-09 DIAGNOSIS — M1612 Unilateral primary osteoarthritis, left hip: Secondary | ICD-10-CM | POA: Diagnosis not present

## 2018-07-09 HISTORY — DX: Malignant (primary) neoplasm, unspecified: C80.1

## 2018-07-09 HISTORY — DX: Presence of functional implant, unspecified: Z96.9

## 2018-07-09 LAB — BASIC METABOLIC PANEL
Anion gap: 8 (ref 5–15)
BUN: 15 mg/dL (ref 8–23)
CO2: 25 mmol/L (ref 22–32)
Calcium: 9 mg/dL (ref 8.9–10.3)
Chloride: 104 mmol/L (ref 98–111)
Creatinine, Ser: 0.62 mg/dL (ref 0.61–1.24)
GFR calc Af Amer: >60 mL/min (ref >60)
GFR calc non Af Amer: >60 mL/min (ref >60)
Glucose, Bld: 127 mg/dL — ABNORMAL HIGH (ref 70–99)
Potassium: 4 mmol/L (ref 3.5–5.1)
Sodium: 137 mmol/L (ref 135–145)

## 2018-07-09 LAB — CBC
HCT: 44.8 % (ref 39.0–52.0)
Hemoglobin: 13.7 g/dL (ref 13.0–17.0)
MCH: 27.5 pg (ref 26.0–34.0)
MCHC: 30.6 g/dL (ref 30.0–36.0)
MCV: 89.8 fL (ref 80.0–100.0)
Platelets: 449 10*3/uL — ABNORMAL HIGH (ref 150–400)
RBC: 4.99 MIL/uL (ref 4.22–5.81)
RDW: 13.5 % (ref 11.5–15.5)
WBC: 9.7 10*3/uL (ref 4.0–10.5)
nRBC: 0 % (ref 0.0–0.2)

## 2018-07-09 LAB — SURGICAL PCR SCREEN
MRSA, PCR: NEGATIVE
Staphylococcus aureus: NEGATIVE

## 2018-07-10 ENCOUNTER — Other Ambulatory Visit: Payer: Self-pay | Admitting: Orthopaedic Surgery

## 2018-07-10 NOTE — Care Plan (Signed)
Spoke with wife prior to surgery. Will discharge to home with family and HHPT. Equipment ordered. OPPT will be determined at follow up visit. He is an amputee and my need further therapy    Ladell Heads, [place]

## 2018-07-17 ENCOUNTER — Ambulatory Visit (HOSPITAL_COMMUNITY): Payer: Medicare Other

## 2018-07-17 ENCOUNTER — Encounter (HOSPITAL_COMMUNITY)
Admission: RE | Disposition: A | Discharge status: Home / Self Care | Payer: Self-pay | Source: Home / Self Care | Attending: Orthopaedic Surgery

## 2018-07-17 ENCOUNTER — Ambulatory Visit (HOSPITAL_COMMUNITY): Payer: Medicare Other | Admitting: Physician Assistant

## 2018-07-17 ENCOUNTER — Encounter (HOSPITAL_COMMUNITY): Payer: Self-pay | Admitting: *Deleted

## 2018-07-17 ENCOUNTER — Inpatient Hospital Stay (HOSPITAL_COMMUNITY): Payer: Medicare Other

## 2018-07-17 ENCOUNTER — Ambulatory Visit (HOSPITAL_COMMUNITY): Payer: Medicare Other | Admitting: Anesthesiology

## 2018-07-17 ENCOUNTER — Inpatient Hospital Stay (HOSPITAL_COMMUNITY)
Admission: RE | Admit: 2018-07-17 | Discharge: 2018-07-19 | DRG: 470 | Disposition: A | Discharge status: Home / Self Care | Payer: Medicare Other | Attending: Orthopaedic Surgery | Admitting: Orthopaedic Surgery

## 2018-07-17 ENCOUNTER — Other Ambulatory Visit: Payer: Self-pay

## 2018-07-17 DIAGNOSIS — Z419 Encounter for procedure for purposes other than remedying health state, unspecified: Secondary | ICD-10-CM

## 2018-07-17 DIAGNOSIS — E785 Hyperlipidemia, unspecified: Secondary | ICD-10-CM | POA: Diagnosis present

## 2018-07-17 DIAGNOSIS — M25572 Pain in left ankle and joints of left foot: Secondary | ICD-10-CM | POA: Diagnosis present

## 2018-07-17 DIAGNOSIS — M25752 Osteophyte, left hip: Secondary | ICD-10-CM | POA: Diagnosis present

## 2018-07-17 DIAGNOSIS — Z96642 Presence of left artificial hip joint: Secondary | ICD-10-CM

## 2018-07-17 DIAGNOSIS — Z9181 History of falling: Secondary | ICD-10-CM

## 2018-07-17 DIAGNOSIS — Z87891 Personal history of nicotine dependence: Secondary | ICD-10-CM

## 2018-07-17 DIAGNOSIS — Z9841 Cataract extraction status, right eye: Secondary | ICD-10-CM | POA: Diagnosis not present

## 2018-07-17 DIAGNOSIS — G8921 Chronic pain due to trauma: Secondary | ICD-10-CM | POA: Diagnosis not present

## 2018-07-17 DIAGNOSIS — Z981 Arthrodesis status: Secondary | ICD-10-CM | POA: Diagnosis not present

## 2018-07-17 DIAGNOSIS — Z89511 Acquired absence of right leg below knee: Secondary | ICD-10-CM | POA: Diagnosis not present

## 2018-07-17 DIAGNOSIS — Z471 Aftercare following joint replacement surgery: Secondary | ICD-10-CM | POA: Diagnosis not present

## 2018-07-17 DIAGNOSIS — F419 Anxiety disorder, unspecified: Secondary | ICD-10-CM | POA: Diagnosis present

## 2018-07-17 DIAGNOSIS — Z85828 Personal history of other malignant neoplasm of skin: Secondary | ICD-10-CM | POA: Diagnosis not present

## 2018-07-17 DIAGNOSIS — Z9842 Cataract extraction status, left eye: Secondary | ICD-10-CM

## 2018-07-17 DIAGNOSIS — M1612 Unilateral primary osteoarthritis, left hip: Principal | ICD-10-CM | POA: Diagnosis present

## 2018-07-17 DIAGNOSIS — Z888 Allergy status to other drugs, medicaments and biological substances status: Secondary | ICD-10-CM

## 2018-07-17 DIAGNOSIS — E876 Hypokalemia: Secondary | ICD-10-CM | POA: Diagnosis not present

## 2018-07-17 DIAGNOSIS — Z881 Allergy status to other antibiotic agents status: Secondary | ICD-10-CM | POA: Diagnosis not present

## 2018-07-17 DIAGNOSIS — Z9089 Acquired absence of other organs: Secondary | ICD-10-CM

## 2018-07-17 HISTORY — PX: TOTAL HIP ARTHROPLASTY: SHX124

## 2018-07-17 SURGERY — ARTHROPLASTY, HIP, TOTAL, ANTERIOR APPROACH
Anesthesia: Spinal | Site: Hip | Laterality: Left

## 2018-07-17 MED ORDER — TRANEXAMIC ACID-NACL 1000-0.7 MG/100ML-% IV SOLN
1000.0000 mg | INTRAVENOUS | Status: AC
  Administered 2018-07-17: 1000 mg via INTRAVENOUS
  Filled 2018-07-17: qty 100

## 2018-07-17 MED ORDER — SODIUM CHLORIDE 0.9 % IV SOLN
INTRAVENOUS | Status: DC
  Administered 2018-07-17: 75 mL/h via INTRAVENOUS
  Administered 2018-07-18: 05:00:00 via INTRAVENOUS

## 2018-07-17 MED ORDER — PROPOFOL 500 MG/50ML IV EMUL
INTRAVENOUS | Status: DC | PRN
  Administered 2018-07-17: 100 ug/kg/min via INTRAVENOUS

## 2018-07-17 MED ORDER — ACETAMINOPHEN 10 MG/ML IV SOLN
INTRAVENOUS | Status: AC
  Filled 2018-07-17: qty 100

## 2018-07-17 MED ORDER — COLESTIPOL HCL 1 G PO TABS
1.0000 g | ORAL_TABLET | Freq: Two times a day (BID) | ORAL | Status: DC
  Administered 2018-07-17 – 2018-07-19 (×4): 1 g via ORAL
  Filled 2018-07-17 (×4): qty 1

## 2018-07-17 MED ORDER — AMITRIPTYLINE HCL 100 MG PO TABS
100.0000 mg | ORAL_TABLET | Freq: Every day | ORAL | Status: DC
  Administered 2018-07-17 – 2018-07-18 (×2): 100 mg via ORAL
  Filled 2018-07-17 (×2): qty 1

## 2018-07-17 MED ORDER — BUPIVACAINE IN DEXTROSE 0.75-8.25 % IT SOLN
INTRATHECAL | Status: DC | PRN
  Administered 2018-07-17: 1.6 mL via INTRATHECAL

## 2018-07-17 MED ORDER — ACETAMINOPHEN 325 MG PO TABS: 325.0000 mg | ORAL_TABLET | Freq: Four times a day (QID) | ORAL | Status: DC | PRN

## 2018-07-17 MED ORDER — METOCLOPRAMIDE HCL 5 MG PO TABS: 5.0000 mg | ORAL_TABLET | Freq: Three times a day (TID) | ORAL | Status: DC | PRN

## 2018-07-17 MED ORDER — PHENYLEPHRINE 40 MCG/ML (10ML) SYRINGE FOR IV PUSH (FOR BLOOD PRESSURE SUPPORT)
PREFILLED_SYRINGE | INTRAVENOUS | Status: AC
  Filled 2018-07-17: qty 10

## 2018-07-17 MED ORDER — PHENOL 1.4 % MT LIQD
1.0000 | OROMUCOSAL | Status: DC | PRN
  Filled 2018-07-17: qty 177

## 2018-07-17 MED ORDER — FENTANYL CITRATE (PF) 100 MCG/2ML IJ SOLN: 25.0000 ug | INTRAMUSCULAR | Status: DC | PRN

## 2018-07-17 MED ORDER — ALPRAZOLAM 0.5 MG PO TABS
0.5000 mg | ORAL_TABLET | Freq: Every evening | ORAL | Status: DC | PRN
  Filled 2018-07-17: qty 1

## 2018-07-17 MED ORDER — CLINDAMYCIN PHOSPHATE 600 MG/50ML IV SOLN
600.0000 mg | Freq: Four times a day (QID) | INTRAVENOUS | Status: AC
  Administered 2018-07-17 (×2): 600 mg via INTRAVENOUS
  Filled 2018-07-17 (×2): qty 50

## 2018-07-17 MED ORDER — FENTANYL CITRATE (PF) 100 MCG/2ML IJ SOLN
INTRAMUSCULAR | Status: AC
  Filled 2018-07-17: qty 2

## 2018-07-17 MED ORDER — METOCLOPRAMIDE HCL 5 MG/ML IJ SOLN: 5.0000 mg | Freq: Three times a day (TID) | INTRAMUSCULAR | Status: DC | PRN

## 2018-07-17 MED ORDER — BISACODYL 10 MG RE SUPP: 10.0000 mg | Freq: Every day | RECTAL | Status: DC | PRN

## 2018-07-17 MED ORDER — MIDAZOLAM HCL 2 MG/2ML IJ SOLN
INTRAMUSCULAR | Status: AC
  Filled 2018-07-17: qty 4

## 2018-07-17 MED ORDER — METHOCARBAMOL 500 MG IVPB - SIMPLE MED
500.0000 mg | Freq: Four times a day (QID) | INTRAVENOUS | Status: DC | PRN
  Filled 2018-07-17: qty 50

## 2018-07-17 MED ORDER — ONDANSETRON HCL 4 MG/2ML IJ SOLN
INTRAMUSCULAR | Status: DC | PRN
  Administered 2018-07-17: 4 mg via INTRAVENOUS

## 2018-07-17 MED ORDER — DEXAMETHASONE SODIUM PHOSPHATE 10 MG/ML IJ SOLN
INTRAMUSCULAR | Status: AC
  Filled 2018-07-17: qty 1

## 2018-07-17 MED ORDER — PHENYLEPHRINE HCL 10 MG/ML IJ SOLN
INTRAMUSCULAR | Status: AC
  Filled 2018-07-17: qty 1

## 2018-07-17 MED ORDER — MENTHOL 3 MG MT LOZG: 1.0000 | LOZENGE | OROMUCOSAL | Status: DC | PRN

## 2018-07-17 MED ORDER — DEXAMETHASONE SODIUM PHOSPHATE 10 MG/ML IJ SOLN
INTRAMUSCULAR | Status: DC | PRN
  Administered 2018-07-17: 10 mg via INTRAVENOUS

## 2018-07-17 MED ORDER — SODIUM CHLORIDE 0.9 % IR SOLN
Status: DC | PRN
  Administered 2018-07-17: 1000 mL

## 2018-07-17 MED ORDER — ALUM & MAG HYDROXIDE-SIMETH 200-200-20 MG/5ML PO SUSP: 30.0000 mL | ORAL | Status: DC | PRN

## 2018-07-17 MED ORDER — ASPIRIN 81 MG PO CHEW
81.0000 mg | CHEWABLE_TABLET | Freq: Two times a day (BID) | ORAL | Status: DC
  Administered 2018-07-17 – 2018-07-19 (×4): 81 mg via ORAL
  Filled 2018-07-17 (×4): qty 1

## 2018-07-17 MED ORDER — HYDROMORPHONE HCL 1 MG/ML IJ SOLN
0.5000 mg | INTRAMUSCULAR | Status: DC | PRN
  Administered 2018-07-17 – 2018-07-18 (×4): 1 mg via INTRAVENOUS
  Filled 2018-07-17 (×4): qty 1

## 2018-07-17 MED ORDER — OXYCODONE HCL 5 MG PO TABS
10.0000 mg | ORAL_TABLET | ORAL | Status: DC | PRN
  Administered 2018-07-17 – 2018-07-18 (×3): 15 mg via ORAL
  Administered 2018-07-18: 10 mg via ORAL
  Administered 2018-07-18 – 2018-07-19 (×6): 15 mg via ORAL
  Filled 2018-07-17 (×8): qty 3

## 2018-07-17 MED ORDER — ONDANSETRON HCL 4 MG PO TABS: 4.0000 mg | ORAL_TABLET | Freq: Four times a day (QID) | ORAL | Status: DC | PRN

## 2018-07-17 MED ORDER — DIPHENHYDRAMINE HCL 12.5 MG/5ML PO ELIX: 12.5000 mg | ORAL_SOLUTION | ORAL | Status: DC | PRN

## 2018-07-17 MED ORDER — ACETAMINOPHEN 10 MG/ML IV SOLN
1000.0000 mg | Freq: Once | INTRAVENOUS | Status: AC
  Administered 2018-07-17: 1000 mg via INTRAVENOUS

## 2018-07-17 MED ORDER — PANTOPRAZOLE SODIUM 40 MG PO TBEC
40.0000 mg | DELAYED_RELEASE_TABLET | Freq: Every day | ORAL | Status: DC
  Administered 2018-07-17 – 2018-07-19 (×3): 40 mg via ORAL
  Filled 2018-07-17 (×3): qty 1

## 2018-07-17 MED ORDER — CHLORHEXIDINE GLUCONATE 4 % EX LIQD: 60.0000 mL | Freq: Once | CUTANEOUS | Status: DC

## 2018-07-17 MED ORDER — CLINDAMYCIN PHOSPHATE 900 MG/50ML IV SOLN
900.0000 mg | INTRAVENOUS | Status: AC
  Administered 2018-07-17: 900 mg via INTRAVENOUS
  Filled 2018-07-17: qty 50

## 2018-07-17 MED ORDER — ONDANSETRON HCL 4 MG/2ML IJ SOLN
INTRAMUSCULAR | Status: AC
  Filled 2018-07-17: qty 2

## 2018-07-17 MED ORDER — OXYCODONE HCL 5 MG PO TABS: 5.0000 mg | ORAL_TABLET | Freq: Once | ORAL | Status: DC | PRN

## 2018-07-17 MED ORDER — FENTANYL CITRATE (PF) 100 MCG/2ML IJ SOLN
INTRAMUSCULAR | Status: DC | PRN
  Administered 2018-07-17: 100 ug via INTRAVENOUS

## 2018-07-17 MED ORDER — ONDANSETRON HCL 4 MG/2ML IJ SOLN: 4.0000 mg | Freq: Once | INTRAMUSCULAR | Status: DC | PRN

## 2018-07-17 MED ORDER — FERROUS GLUCONATE 324 (38 FE) MG PO TABS
324.0000 mg | ORAL_TABLET | Freq: Every day | ORAL | Status: DC
  Administered 2018-07-18 – 2018-07-19 (×2): 324 mg via ORAL
  Filled 2018-07-17 (×2): qty 1

## 2018-07-17 MED ORDER — PROPOFOL 10 MG/ML IV BOLUS
INTRAVENOUS | Status: DC | PRN
  Administered 2018-07-17 (×3): 20 mg via INTRAVENOUS

## 2018-07-17 MED ORDER — METHOCARBAMOL 500 MG PO TABS
500.0000 mg | ORAL_TABLET | Freq: Four times a day (QID) | ORAL | Status: DC | PRN
  Administered 2018-07-17 – 2018-07-19 (×5): 500 mg via ORAL
  Filled 2018-07-17 (×5): qty 1

## 2018-07-17 MED ORDER — PROPOFOL 10 MG/ML IV BOLUS
INTRAVENOUS | Status: AC
  Filled 2018-07-17: qty 60

## 2018-07-17 MED ORDER — POLYETHYLENE GLYCOL 3350 17 G PO PACK
17.0000 g | PACK | Freq: Every day | ORAL | Status: DC | PRN
  Administered 2018-07-19: 17 g via ORAL
  Filled 2018-07-17: qty 1

## 2018-07-17 MED ORDER — OXYCODONE HCL 5 MG/5ML PO SOLN: 5.0000 mg | Freq: Once | ORAL | Status: DC | PRN

## 2018-07-17 MED ORDER — DOCUSATE SODIUM 100 MG PO CAPS
100.0000 mg | ORAL_CAPSULE | Freq: Two times a day (BID) | ORAL | Status: DC
  Administered 2018-07-17 – 2018-07-19 (×4): 100 mg via ORAL
  Filled 2018-07-17 (×4): qty 1

## 2018-07-17 MED ORDER — STERILE WATER FOR IRRIGATION IR SOLN
Status: DC | PRN
  Administered 2018-07-17: 2000 mL

## 2018-07-17 MED ORDER — SODIUM CHLORIDE 0.9 % IV SOLN
INTRAVENOUS | Status: DC | PRN
  Administered 2018-07-17: 50 ug/min via INTRAVENOUS

## 2018-07-17 MED ORDER — PHENYLEPHRINE 40 MCG/ML (10ML) SYRINGE FOR IV PUSH (FOR BLOOD PRESSURE SUPPORT)
PREFILLED_SYRINGE | INTRAVENOUS | Status: DC | PRN
  Administered 2018-07-17: 80 ug via INTRAVENOUS

## 2018-07-17 MED ORDER — OXYCODONE HCL 5 MG PO TABS
5.0000 mg | ORAL_TABLET | ORAL | Status: DC | PRN
  Administered 2018-07-17: 10 mg via ORAL
  Administered 2018-07-17: 5 mg via ORAL
  Administered 2018-07-18: 10 mg via ORAL
  Filled 2018-07-17 (×4): qty 2
  Filled 2018-07-17: qty 1

## 2018-07-17 MED ORDER — LACTATED RINGERS IV SOLN
INTRAVENOUS | Status: DC
  Administered 2018-07-17: 08:00:00 via INTRAVENOUS
  Administered 2018-07-17: 1000 mL via INTRAVENOUS
  Administered 2018-07-17: 11:00:00 via INTRAVENOUS

## 2018-07-17 MED ORDER — MIDAZOLAM HCL 5 MG/5ML IJ SOLN
INTRAMUSCULAR | Status: DC | PRN
  Administered 2018-07-17 (×2): 1 mg via INTRAVENOUS

## 2018-07-17 MED ORDER — ONDANSETRON HCL 4 MG/2ML IJ SOLN: 4.0000 mg | Freq: Four times a day (QID) | INTRAMUSCULAR | Status: DC | PRN

## 2018-07-17 SURGICAL SUPPLY — 42 items
ARTICULEZE HEAD (Hips) ×3 IMPLANT
BAG SPEC THK2 15X12 ZIP CLS (MISCELLANEOUS) ×1
BAG ZIPLOCK 12X15 (MISCELLANEOUS) ×2 IMPLANT
BLADE SAW SGTL 18X1.27X75 (BLADE) ×2 IMPLANT
BLADE SAW SGTL 18X1.27X75MM (BLADE) ×1
BLADE SURG SZ10 CARB STEEL (BLADE) ×6 IMPLANT
COVER PERINEAL POST (MISCELLANEOUS) ×3 IMPLANT
COVER SURGICAL LIGHT HANDLE (MISCELLANEOUS) ×3 IMPLANT
COVER WAND RF STERILE (DRAPES) IMPLANT
CUP SECTOR GRIPTON 58MM (Orthopedic Implant) ×2 IMPLANT
DRAPE STERI IOBAN 125X83 (DRAPES) ×3 IMPLANT
DRAPE U-SHAPE 47X51 STRL (DRAPES) ×6 IMPLANT
DRSG AQUACEL AG ADV 3.5X10 (GAUZE/BANDAGES/DRESSINGS) ×3 IMPLANT
DURAPREP 26ML APPLICATOR (WOUND CARE) ×3 IMPLANT
ELECT REM PT RETURN 15FT ADLT (MISCELLANEOUS) ×3 IMPLANT
GAUZE XEROFORM 1X8 LF (GAUZE/BANDAGES/DRESSINGS) ×2 IMPLANT
GLOVE BIO SURGEON STRL SZ7.5 (GLOVE) ×3 IMPLANT
GLOVE BIOGEL PI IND STRL 7.0 (GLOVE) IMPLANT
GLOVE BIOGEL PI IND STRL 7.5 (GLOVE) IMPLANT
GLOVE BIOGEL PI IND STRL 8 (GLOVE) ×2 IMPLANT
GLOVE BIOGEL PI INDICATOR 7.0 (GLOVE) ×4
GLOVE BIOGEL PI INDICATOR 7.5 (GLOVE) ×6
GLOVE BIOGEL PI INDICATOR 8 (GLOVE) ×4
GLOVE ECLIPSE 8.0 STRL XLNG CF (GLOVE) ×3 IMPLANT
GLOVE SURG SS PI 7.0 STRL IVOR (GLOVE) ×2 IMPLANT
GOWN STRL REUS W/TWL LRG LVL3 (GOWN DISPOSABLE) ×2 IMPLANT
GOWN STRL REUS W/TWL XL LVL3 (GOWN DISPOSABLE) ×8 IMPLANT
HANDPIECE INTERPULSE COAX TIP (DISPOSABLE) ×3
HEAD ARTICULEZE (Hips) IMPLANT
HOLDER FOLEY CATH W/STRAP (MISCELLANEOUS) ×3 IMPLANT
LINER NEUTRAL 52X36X58N (Liner) ×2 IMPLANT
PACK ANTERIOR HIP CUSTOM (KITS) ×3 IMPLANT
SET HNDPC FAN SPRY TIP SCT (DISPOSABLE) ×1 IMPLANT
STAPLER VISISTAT 35W (STAPLE) ×2 IMPLANT
STEM CORAIL KA15 (Stem) ×2 IMPLANT
SUT ETHIBOND NAB CT1 #1 30IN (SUTURE) ×3 IMPLANT
SUT VIC AB 0 CT1 36 (SUTURE) ×3 IMPLANT
SUT VIC AB 1 CT1 36 (SUTURE) ×3 IMPLANT
SUT VIC AB 2-0 CT1 27 (SUTURE) ×6
SUT VIC AB 2-0 CT1 TAPERPNT 27 (SUTURE) ×2 IMPLANT
TRAY FOLEY MTR SLVR 16FR STAT (SET/KITS/TRAYS/PACK) ×3 IMPLANT
YANKAUER SUCT BULB TIP 10FT TU (MISCELLANEOUS) ×3 IMPLANT

## 2018-07-17 NOTE — Anesthesia Procedure Notes (Signed)
Date/Time: 07/17/2018 9:08 AM Performed by: Sharlette Dense, CRNA Oxygen Delivery Method: Simple face mask

## 2018-07-17 NOTE — Anesthesia Preprocedure Evaluation (Addendum)
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), Well Controlled,  PSYCHIATRIC DISORDERS Anxiety  Insomnia Lumbar spinal stenosis     GI/Hepatic negative GI ROS, Neg liver ROS,   Endo/Other  negative endocrine ROS  Renal/GU negative Renal ROS     Musculoskeletal  (+) Arthritis ,  Chronic left ankle and back pain    Abdominal   Peds  Hematology negative hematology ROS (+)   Anesthesia Other Findings   Reproductive/Obstetrics                            Anesthesia Physical Anesthesia Plan  ASA: II  Anesthesia Plan: Spinal   Post-op Pain Management:    Induction:   PONV Risk Score and Plan: 1 and Treatment may vary due to age or medical condition and Propofol infusion  Airway Management Planned: Natural Airway and Simple Face Mask  Additional Equipment: None  Intra-op Plan:   Post-operative Plan:   Informed Consent: I have reviewed the patients History and Physical, chart, labs and discussed the procedure including the risks, benefits and alternatives for the proposed anesthesia with the patient or authorized representative who has indicated his/her understanding and acceptance.       Plan Discussed with: CRNA and Anesthesiologist  Anesthesia Plan Comments: (Labs reviewed, platelets acceptable. Discussed risks and benefits of spinal, including  spinal/epidural hematoma, infection, failed block, and PDPH. Patient expressed understanding and wished to proceed. )       Anesthesia Quick Evaluation

## 2018-07-17 NOTE — Evaluation (Signed)
Physical Therapy Evaluation Patient Details Name: Allen Taylor MRN: [id number] DOB: 28-Apr-1946 Today's Date: 07/17/2018   History of Present Illness  73 yo male s/p L DA-THA on 07/17/18. PMH includes R BKA with prosthesis, OA, syncope, lumbar spinal stenosis with spinal fusion, HLD, hypokalema, anxiety, cancer, cataracts extraction.   Clinical Impression   Pt presents with L hip pain, difficulty performing bed mobility, increased time and effort to perform mobility tasks, and decreased tolerance for ambulation due to hip pain. Pt to benefit from acute PT to address deficits. Pt ambulated 100 ft with RW with min guard assist. Pt highly motivated to progress mobility.  Pt educated on heel slides (5/hour) to perform this afternoon/evening to increase circulation, to pt's tolerance and limited by pain. Pt unable to perform ankle pumps for circulation. PT to progress mobility as tolerated, and will continue to follow acutely.        Follow Up Recommendations Follow surgeon's recommendation for DC plan and follow-up therapies;Supervision for mobility/OOB(HHPT)    Equipment Recommendations  Rolling walker with 5" wheels    Recommendations for Other Services       Precautions / Restrictions Precautions Precautions: Fall Restrictions Weight Bearing Restrictions: No      Mobility  Bed Mobility Overal bed mobility: Needs Assistance Bed Mobility: Supine to Sit     Supine to sit: HOB elevated;Min assist     General bed mobility comments: min assist for scooting to EOB, increased time and effort to perform. Pt donned R prosthesis at EOB.  Transfers Overall transfer level: Needs assistance Equipment used: Rolling walker (2 wheeled) Transfers: Sit to/from Stand Sit to Stand: Min guard;From elevated surface         General transfer comment: Min guard for safety. Pt educated on proper hand placement for standing, but pt pushed on crossbar of RW instead of using PT recommendation.    Ambulation/Gait Ambulation/Gait assistance: Min guard;+2 safety/equipment(chair follow) Gait Distance (Feet): 100 Feet Assistive device: Rolling walker (2 wheeled) Gait Pattern/deviations: Step-to pattern;Decreased stance time - left;Decreased weight shift to left;Antalgic;Trunk flexed Gait velocity: decr    General Gait Details: Min guard for safety. Verbal cuing for placement in RW, erect posture, turning.   Stairs            Wheelchair Mobility    Modified Rankin (Stroke Patients Only)       Balance Overall balance assessment: Mild deficits observed, not formally tested                                           Pertinent Vitals/Pain Pain Assessment: 0-10 Pain Score: 5  Pain Location: L hip  Pain Descriptors / Indicators: Burning;Aching;Sore Pain Intervention(s): Limited activity within patient's tolerance;Repositioned;Ice applied;Monitored during session;Premedicated before session    Home Living Family/patient expects to be discharged to:: Private residence Living Arrangements: Spouse/significant other Available Help at Discharge: Family;Available 24 hours/day Type of Home: House Home Access: Stairs to enter Entrance Stairs-Rails: Chemical engineer of Steps: 3 Home Layout: One level Home Equipment: Tub bench;Grab bars - tub/shower;Cane - single point      Prior Function Level of Independence: Independent               Hand Dominance   Dominant Hand: Right    Extremity/Trunk Assessment   Upper Extremity Assessment Upper Extremity Assessment: Overall WFL for tasks assessed    Lower Extremity Assessment  Lower Extremity Assessment: Overall WFL for tasks assessed;LLE deficits/detail;RLE deficits/detail RLE Deficits / Details: R BKA for 51 years, independent with prosthesis donning and doffing at EOB LLE Deficits / Details: L ankle fusion, so no DF/PF ability. suspected post-surgical hip weakness; able to perform  quad set, heel slides LLE Sensation: WNL    Cervical / Trunk Assessment Cervical / Trunk Assessment: Normal  Communication   Communication: No difficulties  Cognition Arousal/Alertness: Awake/alert Behavior During Therapy: WFL for tasks assessed/performed Overall Cognitive Status: Within Functional Limits for tasks assessed                                        General Comments      Exercises     Assessment/Plan    PT Assessment Patient needs continued PT services  PT Problem List Decreased strength;Pain;Decreased activity tolerance;Decreased balance;Decreased knowledge of use of DME;Decreased mobility       PT Treatment Interventions DME instruction;Therapeutic activities;Gait training;Therapeutic exercise;Patient/family education;Stair training;Balance training;Functional mobility training    PT Goals (Current goals can be found in the Care Plan section)  Acute Rehab PT Goals Patient Stated Goal: none stated  PT Goal Formulation: With patient Time For Goal Achievement: 07/24/18 Potential to Achieve Goals: Good    Frequency 7X/week   Barriers to discharge        Co-evaluation               AM-PAC PT "6 Clicks" Mobility  Outcome Measure Help needed turning from your back to your side while in a flat bed without using bedrails?: A Little Help needed moving from lying on your back to sitting on the side of a flat bed without using bedrails?: A Little Help needed moving to and from a bed to a chair (including a wheelchair)?: A Little Help needed standing up from a chair using your arms (e.g., wheelchair or bedside chair)?: A Little Help needed to walk in hospital room?: A Little Help needed climbing 3-5 steps with a railing? : A Little 6 Click Score: 18    End of Session Equipment Utilized During Treatment: Gait belt Activity Tolerance: Patient tolerated treatment well Patient left: in chair;with chair alarm set;with call bell/phone within  reach;with family/visitor present;with SCD's reapplied Nurse Communication: Mobility status PT Visit Diagnosis: Other abnormalities of gait and mobility (R26.89);Difficulty in walking, not elsewhere classified (R26.2)    Time: 3810-1751 PT Time Calculation (min) (ACUTE ONLY): 25 min   Charges:   PT Evaluation $PT Eval Low Complexity: 1 Low PT Treatments $Gait Training: 8-22 mins        Julien Girt, PT Acute Rehabilitation Services Pager [phone]  Office [phone]   Roxine Caddy D Elonda Husky 07/17/2018, 6:49 PM

## 2018-07-17 NOTE — Anesthesia Postprocedure Evaluation (Signed)
Anesthesia Post Note  Patient: Allen Taylor  Procedure(s) Performed: LEFT TOTAL HIP ARTHROPLASTY ANTERIOR APPROACH (Left Hip)     Patient location during evaluation: PACU Anesthesia Type: Spinal Level of consciousness: awake and alert Pain management: pain level controlled Vital Signs Assessment: post-procedure vital signs reviewed and stable Respiratory status: spontaneous breathing and respiratory function stable Cardiovascular status: blood pressure returned to baseline and stable Postop Assessment: spinal receding and no apparent nausea or vomiting Anesthetic complications: no Comments: Complaining of bilateral shoulder tenderness and pain at rest and with movement. Likely positioning related. Patient stated he was comfortable once positioned for surgery prior to being sedated further, but woke up with this pain. Will monitor.    Last Vitals:  Vitals:   07/17/18 1215 07/17/18 1230  BP: 93/60 99/78  Pulse: (!) 56 76  Resp: 13 14  Temp:    SpO2: 100% 100%    Last Pain:  Vitals:   07/17/18 1315  TempSrc:   PainSc: 0-No pain                 Audry Pili

## 2018-07-17 NOTE — Brief Op Note (Signed)
07/17/2018  11:26 AM  PATIENT:  Allen Taylor  73 y.o. male  PRE-OPERATIVE DIAGNOSIS:  osteoarthritis left hip  POST-OPERATIVE DIAGNOSIS:  osteoarthritis left hip  PROCEDURE:  Procedure(s): LEFT TOTAL HIP ARTHROPLASTY ANTERIOR APPROACH (Left)  SURGEON:  Surgeon(s) and Role:    Mcarthur Rossetti, MD - Primary  PHYSICIAN ASSISTANT: Benita Stabile, PA-C  ANESTHESIA:   spinal  EBL:  250 mL   COUNTS:  YES  TOURNIQUET:  * No tourniquets in log *  DICTATION: .Other Dictation: Dictation Number [id number]  PLAN OF CARE: Admit to inpatient   PATIENT DISPOSITION:  PACU - hemodynamically stable.   Delay start of Pharmacological VTE agent (>24hrs) due to surgical blood loss or risk of bleeding: no
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
## 2018-07-17 NOTE — Care Management Note (Signed)
Case Management Note  Patient Details  Name: HOSEA HANAWALT MRN: [id number] Date of Birth: 06/11/1946  Subjective/Objective:                    Action/Plan:Home with [facility] at home   Expected Discharge Date:                  Expected Discharge Plan:  [place]  In-House Referral:     Discharge planning Services     Post Acute Care Choice:    Choice offered to:  Patient  DME Arranged:  [facility] rolling DME Agency:  [place]:  OT Home:  [facility]  Status of Service:  Completed, signed off  If discussed at [employer] of Stay Meetings, dates discussed:    Additional CommentsPurcell Mouton, RN 07/17/2018, 2:17 PM

## 2018-07-17 NOTE — Anesthesia Procedure Notes (Signed)
Spinal  Patient location during procedure: OR Start time: 07/17/2018 10:01 AM End time: 07/17/2018 10:07 AM Staffing Anesthesiologist: Audry Pili, MD Performed: anesthesiologist  Preanesthetic Checklist Completed: patient identified, surgical consent, pre-op evaluation, timeout performed, IV checked, risks and benefits discussed and monitors and equipment checked Spinal Block Patient position: sitting Prep: DuraPrep Patient monitoring: heart rate, cardiac monitor, continuous pulse ox and blood pressure Approach: midline Location: L3-4 Injection technique: single-shot Needle Needle type: Quincke  Needle gauge: 22 G Additional Notes Consent was obtained prior to the procedure with all questions answered and concerns addressed. Risks including, but not limited to, bleeding, infection, nerve damage, paralysis, failed block, inadequate analgesia, allergic reaction, high spinal, itching, and headache were discussed and the patient wished to proceed. Functioning IV was confirmed and monitors were applied. Sterile prep and drape, including hand hygiene, mask, and sterile gloves were used. The patient was positioned and the spine was prepped. The skin was anesthetized with lidocaine. First attempt by CRNA unsuccessful. Second attempt by Dr. Fransisco Beau successful. Free flow of clear CSF was obtained prior to injecting local anesthetic into the CSF. The spinal needle aspirated freely following injection. The needle was carefully withdrawn. The patient tolerated the procedure well.   Allen Don, MD

## 2018-07-17 NOTE — Transfer of Care (Signed)
Immediate Anesthesia Transfer of Care Note  Patient: Allen Taylor  Procedure(s) Performed: LEFT TOTAL HIP ARTHROPLASTY ANTERIOR APPROACH (Left Hip)  Patient Location: PACU  Anesthesia Type:Spinal  Level of Consciousness: awake  Airway & Oxygen Therapy: Patient Spontanous Breathing and Patient connected to nasal cannula oxygen  Post-op Assessment: Report given to RN and Post -op Vital signs reviewed and stable  Post vital signs: Reviewed and stable  Last Vitals:  Vitals Value Taken Time  BP    Temp    Pulse 80 07/17/2018 11:44 AM  Resp 3 07/17/2018 11:44 AM  SpO2 97 % 07/17/2018 11:44 AM  Vitals shown include unvalidated device data.  Last Pain:  Vitals:   07/17/18 0754  TempSrc:   PainSc: 5       Patients Stated Pain Goal: 5 (99/24/26 8341)  Complications: No apparent anesthesia complications

## 2018-07-17 NOTE — Care Plan (Signed)
Ortho Bundle Case Management Note  Patient Details  Name: Allen Taylor MRN: [id number] Date of Birth: Aug 12, 1945  Spoke with patient prior to surgery. He will discharge to home with family and HHPT as arranged. He will need a rolling walker delivered to hospital prior to discharge.                   DME Arranged:  [facility] R[facility] rolling DME Agency:  [place] Arranged:  OT [place] Agency:  [facility]  Additional Comments: Please contact me with any questions of if this plan should need to change.  Ladell Heads,  [place] Specialist  [phone] 07/17/2018, 11:19 AM

## 2018-07-17 NOTE — Op Note (Signed)
NAME: Allen Taylor, [place]LA[place] MEDICAL RECORD VO:[id number] ACCOUNT [id number] DATE OF BIRTH:01-Jul-1946 FACILITY: [facility] LOCATION: [facility] PHYSICIAN:Kendrik Mcshan [place], MD  OPERATIVE REPORT  DATE OF PROCEDURE:  07/17/2018  PREOPERATIVE DIAGNOSIS:  Primary osteoarthritis and degenerative joint disease, left hip.  POSTOPERATIVE DIAGNOSIS:  Primary osteoarthritis and degenerative joint disease, left hip.  PROCEDURE:  Left total hip arthroplasty through direct anterior approach.  IMPLANTS:  DePuy Sector Gription acetabular component size 58, size 36+0 neutral polyethylene liner, size 15 Corail femoral component with standard offset, size 36+5 metal hip ball.  SURGEON:  Lind Guest. Ninfa Linden, MD  ASSISTANT:  Erskine Emery, PA-C  ANESTHESIA:  Spinal.  ANTIBIOTICS:  900 mg IV clindamycin.  ESTIMATED BLOOD LOSS:  250-300 mL.  COMPLICATIONS:  None.  INDICATION:  The patient is a very pleasant 73 year old gentleman with debilitating left hip pain.  He actually has a history of a right below-knee amputation.  He does wear a prosthesis.  His left hip does show radiographic evidence of severe arthritis.   He has tried and failed all forms of conservative treatment including intra-articular injection, which helped him for a while, but now his pain is back and it is severe.  His left hip pain has detrimentally affected his mobility, his quality of life,  and his activities of daily living.  He at this point does wish to proceed with a total hip arthroplasty through direct anterior approach.  He understands fully the risk of acute blood loss anemia, nerve or vessel injury, fracture, infection,  dislocation, DVT.  He understands our goals are to decrease pain, improve mobility and overall improve quality of life.  DESCRIPTION OF PROCEDURE:  After informed consent was obtained and appropriate left hip was marked, he was brought to the operating room, sat up on a stretcher where spinal anesthesia  was obtained.  He was then laid supine on the stretcher.  A Foley  catheter was placed, and a traction boot was placed on his left operative leg.  We did not place his prosthetic leg on his right side.  We then placed him supine on the Hana fracture table, the perineal post in place, his left leg in in-line skeletal  traction, and the right leg had a well leg holder.  It was appropriate in place and secured to this.  We then prepped his left operative hip with DuraPrep and sterile drapes.  A time-out was called.  He was identified as correct patient, correct left  hip.  We then made an incision just inferior and posterior to the anterior superior iliac spine and carried this obliquely down the leg.  We dissected down tensor fascia lata muscle.  Tensor fascia was then divided longitudinally to proceed with a direct  anterior approach to the hip.  We identified and cauterized circumflex vessels and identified the hip capsule, opened the hip capsule in an L-type format, finding moderate joint effusion and significant periarticular osteophytes around the femoral head  and neck.  We then placed Cobra retractors around the medial and lateral femoral neck and made our femoral neck cut with an oscillating saw just proximal to the lesser trochanter and completed this with an osteotome.  We placed a corkscrew guide in the  femoral head and removed the femoral head in its entirety and found it to be devoid of cartilage.  We then placed a bent Hohmann over the medial acetabular rim and removed remnants of the acetabular labrum and other debris and began reaming in stepwise  increments from a  size 44 reamer going all the way up to a size 57 with all reamers under direct visualization, the last reamer under direct fluoroscopy, so I could obtain my depth of reaming, my inclination and anteversion.  We then placed the real  DePuy Sector Gription acetabular component size 58 and a 36+0 neutral polyethylene liner for that  size acetabular component.  Attention was then turned to the femur.  With the leg externally rotated to 120 degrees, extended and adducted, we placed a  Mueller retractor medially and a Hohmann retractor above the greater trochanter.  We released the lateral joint capsule and used a box-cutting osteotome to enter the femoral canal and a rongeur to lateralize.  I then began broaching from a size 8 broach  using Corail broaching system going up to a size 15.  With the 15 in place, we trialed a standard offset femoral neck and 36+1 hip ball reduced in the acetabulum, and it felt stable, but I did feel like we needed a little bit more leg length and offset  based on the fact that he will put more weight through this leg as he puts on his right below-knee prosthesis.  We then dislocated the hip and removed the trial components.  I placed the real Corail femoral component size 15 with standard offset, and we  went with a 36+5 metal hip ball, reduced this in the acetabulum, and I was pleased with stability on exam.  We then irrigated the soft tissue with normal saline solution.  We confirmed placement of the components also under direct fluoroscopy.  We then  closed the joint capsule with interrupted #1 Ethibond suture, followed by running 0 Vicryl in tensor fascia, 0 Vicryl in the deep tissue, 2-0 Vicryl subcutaneous tissue and interrupted staples on the skin.  Xeroform and an Aquacel dressing were applied.   He was taken off the Hana table and taken to recovery room in stable condition.  All final counts were correct.  There were no complications noted.  Of note, Benita Stabile, PA-C, assisted during the entire case, and his assistance was crucial for  facilitating all aspects of this case.  LN/NUANCE  D:07/17/2018 T:07/17/2018 JOB:[id number]

## 2018-07-18 LAB — BASIC METABOLIC PANEL
Anion gap: 10 (ref 5–15)
BUN: 13 mg/dL (ref 8–23)
CO2: 25 mmol/L (ref 22–32)
Calcium: 8.5 mg/dL — ABNORMAL LOW (ref 8.9–10.3)
Chloride: 99 mmol/L (ref 98–111)
Creatinine, Ser: 0.58 mg/dL — ABNORMAL LOW (ref 0.61–1.24)
GFR calc Af Amer: >60 mL/min (ref >60)
GFR calc non Af Amer: >60 mL/min (ref >60)
Glucose, Bld: 130 mg/dL — ABNORMAL HIGH (ref 70–99)
Potassium: 3.8 mmol/L (ref 3.5–5.1)
Sodium: 134 mmol/L — ABNORMAL LOW (ref 135–145)

## 2018-07-18 LAB — CBC
HCT: 39.6 % (ref 39.0–52.0)
Hemoglobin: 12.1 g/dL — ABNORMAL LOW (ref 13.0–17.0)
MCH: 27.3 pg (ref 26.0–34.0)
MCHC: 30.6 g/dL (ref 30.0–36.0)
MCV: 89.2 fL (ref 80.0–100.0)
Platelets: 313 10*3/uL (ref 150–400)
RBC: 4.44 MIL/uL (ref 4.22–5.81)
RDW: 13.3 % (ref 11.5–15.5)
WBC: 15.7 10*3/uL — ABNORMAL HIGH (ref 4.0–10.5)
nRBC: 0 % (ref 0.0–0.2)

## 2018-07-18 NOTE — Care Plan (Signed)
Ortho Bundle Case Management Note  Patient Details  Name: Allen Taylor MRN: [id number] Date of Birth: Nov 22, 1945  Patient is going home with HHPT not OT as previously documented.    DME Arranged:  Gilford Rile rolling DME Agency:  [place] Arranged:  PT [place] Agency:  [facility]  Additional Comments: Please contact me with any questions of if this plan should need to change.  Ladell Heads,  [place] Specialist  [phone] 07/18/2018, 9:11 AM

## 2018-07-18 NOTE — Progress Notes (Signed)
Subjective: 1 Day Post-Op Procedure(s) (LRB): LEFT TOTAL HIP ARTHROPLASTY ANTERIOR APPROACH (Left) Patient reports pain as moderate.    Objective: Vital signs in last 24 hours: Temp:  [97.5 F (36.4 C)-98.7 F (37.1 C)] 97.8 F (36.6 C) (01/18 0433) Pulse Rate:  [56-108] 91 (01/18 0433) Resp:  [11-18] 16 (01/18 0433) BP: (93-156)/(60-109) 137/88 (01/18 0433) SpO2:  [95 %-100 %] 99 % (01/18 0433) Weight:  [86.5 kg] 86.5 kg (01/17 1353)  Intake/Output from previous day: 01/17 0701 - 01/18 0700 In: 3856 [P.O.:600; I.V.:3056; IV Piggyback:200] Out: 2700 [Urine:2450; Blood:250] Intake/Output this shift: No intake/output data recorded.  Recent Labs    07/18/18 0357  HGB 12.1*   Recent Labs    07/18/18 0357  WBC 15.7*  RBC 4.44  HCT 39.6  PLT 313   Recent Labs    07/18/18 0357  NA 134*  K 3.8  CL 99  CO2 25  BUN 13  CREATININE 0.58*  GLUCOSE 130*  CALCIUM 8.5*   No results for input(s): LABPT, INR in the last 72 hours.  Sensation intact distally Intact pulses distally Dorsiflexion/Plantar flexion intact Incision: scant drainage  Assessment/Plan: 1 Day Post-Op Procedure(s) (LRB): LEFT TOTAL HIP ARTHROPLASTY ANTERIOR APPROACH (Left) Up with therapy Discharge home with home health next 1-2 days.    Mcarthur Rossetti 07/18/2018, 7:23 AM

## 2018-07-18 NOTE — Progress Notes (Signed)
Physical Therapy Treatment Patient Details Name: Allen Taylor MRN: [id number] DOB: 1945-07-22 Today's Date: 07/18/2018    History of Present Illness 73 yo male s/p L DA-THA on 07/17/18. PMH includes R BKA with prosthesis, OA, syncope, lumbar spinal stenosis with spinal fusion, HLD, hypokalema, anxiety, cancer, cataracts extraction.     PT Comments    Pt is up to walk with minor help and is demonstrating esp good control considering he is using a R BK prosthesis to walk.  Very interested in learning exercises, and in practicing stairs to transition into the house.  Will anticipate a good outcome for him given his adaptation to the prior limits, and his motivation to continue progressing functionally as much as he can.  Follow acutely for same, and will cover stairs next visit.    Follow Up Recommendations  Follow surgeon's recommendation for DC plan and follow-up therapies;Supervision for mobility/OOB     Equipment Recommendations  Rolling walker with 5" wheels    Recommendations for Other Services       Precautions / Restrictions Precautions Precautions: Fall Restrictions Weight Bearing Restrictions: No    Mobility  Bed Mobility               General bed mobility comments: up when PT arrived  Transfers Overall transfer level: Needs assistance Equipment used: Rolling walker (2 wheeled);1 person hand held assist Transfers: Sit to/from Stand Sit to Stand: Min guard(for safety)            Ambulation/Gait Ambulation/Gait assistance: Min guard Gait Distance (Feet): 150 Feet Assistive device: Rolling walker (2 wheeled) Gait Pattern/deviations: Step-to pattern;Step-through pattern;Decreased stride length;Wide base of support;Trunk flexed;Decreased weight shift to left(stepped backward to test RLE prosthesis) Gait velocity: decr  Gait velocity interpretation: <1.31 ft/sec, indicative of household ambulator     Physiological scientist Rankin (Stroke Patients Only)       Balance Overall balance assessment: Needs assistance Sitting-balance support: Feet supported Sitting balance-Leahy Scale: Good     Standing balance support: Bilateral upper extremity supported;During functional activity Standing balance-Leahy Scale: Fair                              Cognition Arousal/Alertness: Awake/alert Behavior During Therapy: WFL for tasks assessed/performed Overall Cognitive Status: Within Functional Limits for tasks assessed                                        Exercises General Exercises - Lower Extremity Ankle Circles/Pumps: AROM;Left;5 reps Quad Sets: AROM;Both;10 reps Gluteal Sets: AROM;Both;10 reps Hip ABduction/ADduction: AAROM;AROM;Both;10 reps    General Comments General comments (skin integrity, edema, etc.): pt is using a BK prosthesis on RLE with pt noting his RLE is the more dependable leg for now      Pertinent Vitals/Pain Pain Assessment: Faces Faces Pain Scale: Hurts a little bit Pain Location: L hip  Pain Descriptors / Indicators: Operative site guarding Pain Intervention(s): Monitored during session;Premedicated before session;Repositioned;Ice applied    Home Living                      Prior Function            PT Goals (current goals can now be found in the care plan  section) Acute Rehab PT Goals Patient Stated Goal: none stated  Progress towards PT goals: Progressing toward goals    Frequency    7X/week      PT Plan Current plan remains appropriate    Co-evaluation              AM-PAC PT "6 Clicks" Mobility   Outcome Measure  Help needed turning from your back to your side while in a flat bed without using bedrails?: None Help needed moving from lying on your back to sitting on the side of a flat bed without using bedrails?: A Little Help needed moving to and from a bed to a chair (including a wheelchair)?: A  Little Help needed standing up from a chair using your arms (e.g., wheelchair or bedside chair)?: A Little Help needed to walk in hospital room?: A Little Help needed climbing 3-5 steps with a railing? : A Little 6 Click Score: 19    End of Session Equipment Utilized During Treatment: Gait belt Activity Tolerance: Patient tolerated treatment well Patient left: in chair;with chair alarm set;with call bell/phone within reach;with family/visitor present Nurse Communication: Mobility status PT Visit Diagnosis: Other abnormalities of gait and mobility (R26.89);Difficulty in walking, not elsewhere classified (R26.2)     Time: 1157-2620 PT Time Calculation (min) (ACUTE ONLY): 23 min  Charges:  $Gait Training: 8-22 mins $Therapeutic Exercise: 8-22 mins                   Ramond Dial 07/18/2018, 11:37 AM  Mee Hives, PT MS Acute Rehab Dept. Number: [place] and [place]

## 2018-07-18 NOTE — Evaluation (Signed)
Physical Therapy Evaluation Patient Details Name: Allen Taylor MRN: [id number] DOB: Oct 30, 1945 Today's Date: 07/18/2018   History of Present Illness  73 yo male s/p L DA-THA on 07/17/18. PMH includes R BKA with prosthesis, OA, syncope, lumbar spinal stenosis with spinal fusion, HLD, hypokalema, anxiety, cancer, cataracts extraction.   Clinical Impression  Pt was seen for mobility on reapplied R BK prosthesis, noting his limited ability to control L hip to stand and to get LLE onto bed.  Pt is motivated but will continue to need rehab to restore more independence with gait and transfers for home.  Wife was not there for gait on steps but can review this in the AM if needed by PT.    Follow Up Recommendations Follow surgeon's recommendation for DC plan and follow-up therapies;Supervision for mobility/OOB    Equipment Recommendations  Rolling walker with 5" wheels    Recommendations for Other Services       Precautions / Restrictions Precautions Precautions: Fall Restrictions Weight Bearing Restrictions: No      Mobility  Bed Mobility               General bed mobility comments: up when PT arrived  Transfers Overall transfer level: Needs assistance Equipment used: Rolling walker (2 wheeled);1 person hand held assist Transfers: Sit to/from Stand Sit to Stand: Min guard(for safety)         General transfer comment: for safety  Ambulation/Gait Ambulation/Gait assistance: Min guard Gait Distance (Feet): 160 Feet Assistive device: Rolling walker (2 wheeled) Gait Pattern/deviations: Step-to pattern;Step-through pattern;Decreased stride length;Wide base of support;Trunk flexed;Decreased weight shift to left(stepped backward to test RLE prosthesis) Gait velocity: decr  Gait velocity interpretation: <1.31 ft/sec, indicative of household ambulator    Stairs Stairs: Yes Stairs assistance: Min guard Stair Management: Two rails;Forwards;Step to pattern Number of Stairs:  10    Wheelchair Mobility    Modified Rankin (Stroke Patients Only)       Balance Overall balance assessment: Needs assistance Sitting-balance support: Feet supported Sitting balance-Leahy Scale: Good     Standing balance support: Bilateral upper extremity supported;During functional activity Standing balance-Leahy Scale: Fair                               Pertinent Vitals/Pain Pain Assessment: Faces Faces Pain Scale: Hurts a little bit Pain Location: L hip  Pain Descriptors / Indicators: Operative site guarding Pain Intervention(s): Limited activity within patient's tolerance;Ice applied    Home Living Family/patient expects to be discharged to:: Private residence Living Arrangements: Spouse/significant other Available Help at Discharge: Family;Available 24 hours/day           Home Equipment: Tub bench;Grab bars - tub/shower;Cane - single point      Prior Function Level of Independence: Independent               Hand Dominance        Extremity/Trunk Assessment   Upper Extremity Assessment Upper Extremity Assessment: Overall WFL for tasks assessed            Communication   Communication: No difficulties  Cognition Arousal/Alertness: Awake/alert Behavior During Therapy: WFL for tasks assessed/performed Overall Cognitive Status: Within Functional Limits for tasks assessed                                        General Comments General  comments (skin integrity, edema, etc.): applied BK prosthesis to stair walk this afternoon    Exercises     Assessment/Plan    PT Assessment    PT Problem List         PT Treatment Interventions      PT Goals (Current goals can be found in the Care Plan section)  Acute Rehab PT Goals Patient Stated Goal: none stated     Frequency 7X/week   Barriers to discharge        Co-evaluation               AM-PAC PT "6 Clicks" Mobility  Outcome Measure Help needed  turning from your back to your side while in a flat bed without using bedrails?: None Help needed moving from lying on your back to sitting on the side of a flat bed without using bedrails?: A Little Help needed moving to and from a bed to a chair (including a wheelchair)?: A Little Help needed standing up from a chair using your arms (e.g., wheelchair or bedside chair)?: A Little Help needed to walk in hospital room?: A Little Help needed climbing 3-5 steps with a railing? : A Little 6 Click Score: 19    End of Session Equipment Utilized During Treatment: Gait belt Activity Tolerance: Patient tolerated treatment well Patient left: with call bell/phone within reach;with family/visitor present;in bed;with bed alarm set Nurse Communication: Mobility status PT Visit Diagnosis: Other abnormalities of gait and mobility (R26.89);Difficulty in walking, not elsewhere classified (R26.2)    Time: 8295-6213 PT Time Calculation (min) (ACUTE ONLY): 21 min   Charges:     PT Treatments $Gait Training: 8-22 mins        Ramond Dial 07/18/2018, 3:10 PM  Mee Hives, PT MS Acute Rehab Dept. Number: [place]

## 2018-07-18 NOTE — Evaluation (Signed)
Occupational Therapy Evaluation Patient Details Name: Allen Taylor MRN: [id number] DOB: Aug 25, 1945 Today's Date: 07/18/2018    History of Present Illness 73 yo male s/p L DA-THA on 07/17/18. PMH includes R BKA with prosthesis, OA, syncope, lumbar spinal stenosis with spinal fusion, HLD, hypokalema, anxiety, cancer, cataracts extraction.    Clinical Impression   Pt was admitted for the above. His wife came at the end of session, and she will assist him at home as needed. Pt would benefit from 3:1 commode. No further OT is needed at this time    Follow Up Recommendations  No OT follow up    Equipment Recommendations  3 in 1 bedside commode    Recommendations for Other Services       Precautions / Restrictions Precautions Precautions: Fall Restrictions Weight Bearing Restrictions: No      Mobility Bed Mobility               General bed mobility comments: in chair  Transfers Overall transfer level: Needs assistance Equipment used: Rolling walker (2 wheeled) Transfers: Sit to/from Stand Sit to Stand: Min guard         General transfer comment: for safety    Balance Overall balance assessment: Needs assistance Sitting-balance support: Feet supported Sitting balance-Leahy Scale: Good     Standing balance support: Bilateral upper extremity supported;During functional activity Standing balance-Leahy Scale: Fair                             ADL either performed or assessed with clinical judgement   ADL Overall ADL's : Needs assistance/impaired                         Toilet Transfer: Minimal assistance;Ambulation;Comfort height toilet;RW;Grab bars             General ADL Comments: Pt wanted to wait for wife to assist with adls when I first checked on him. Based on clinical judgment, he needs set up for UB and mod A for LB.  He has a tub bench at home.  He was able to get on/off regular commode with sink, but now needs increased  assistance. Talked to him and wife about getting 3:1 and they are in agreement     Vision         Perception     Praxis      Pertinent Vitals/Pain Pain Assessment: Faces Faces Pain Scale: Hurts a little bit Pain Location: L hip  Pain Descriptors / Indicators: Operative site guarding Pain Intervention(s): Limited activity within patient's tolerance;Monitored during session;Premedicated before session;Repositioned     Hand Dominance     Extremity/Trunk Assessment Upper Extremity Assessment Upper Extremity Assessment: Overall WFL for tasks assessed           Communication Communication Communication: No difficulties   Cognition Arousal/Alertness: Awake/alert Behavior During Therapy: WFL for tasks assessed/performed Overall Cognitive Status: Within Functional Limits for tasks assessed                                     General Comments  pt is using a BK prosthesis on RLE ble leg for now    Exercises    Shoulder Instructions      Home Living Family/patient expects to be discharged to:: Private residence Living Arrangements: Spouse/significant other Available Help at Discharge: Family;Available 24  hours/day               Bathroom Shower/Tub: Teacher, early years/pre: Standard     Home Equipment: Tub bench;Grab bars - tub/shower;Cane - single point          Prior Functioning/Environment Level of Independence: Independent                 OT Problem List:        OT Treatment/Interventions:      OT Goals(Current goals can be found in the care plan section) Acute Rehab OT Goals Patient Stated Goal: return to independence OT Goal Formulation: All assessment and education complete, DC therapy  OT Frequency:     Barriers to D/C:            Co-evaluation              AM-PAC OT "6 Clicks" Daily Activity     Outcome Measure Help from another person eating meals?: None Help from another person taking care of  personal grooming?: A Little Help from another person toileting, which includes using toliet, bedpan, or urinal?: A Little Help from another person bathing (including washing, rinsing, drying)?: A Lot Help from another person to put on and taking off regular upper body clothing?: A Little Help from another person to put on and taking off regular lower body clothing?: A Lot 6 Click Score: 17   End of Session    Activity Tolerance: Patient tolerated treatment well Patient left: in chair;with call bell/phone within reach;with family/visitor present  OT Visit Diagnosis: Pain Pain - Right/Left: Left Pain - part of body: Hip                Time: 4665-9935 OT Time Calculation (min): 14 min Charges:  OT General Charges $OT Visit: 1 Visit OT Evaluation $OT Eval Low Complexity: [place], OTR/L Acute Rehabilitation Services [phone] [facility] pager [phone] office 07/18/2018  [place] 07/18/2018, 12:26 PM

## 2018-07-19 MED ORDER — METHOCARBAMOL 500 MG PO TABS: 500.0000 mg | ORAL_TABLET | Freq: Four times a day (QID) | ORAL | 1 refills | Status: DC | PRN

## 2018-07-19 MED ORDER — OXYCODONE HCL 5 MG PO TABS: 5.0000 mg | ORAL_TABLET | ORAL | 0 refills | Status: AC | PRN

## 2018-07-19 MED ORDER — ASPIRIN 81 MG PO CHEW: 81.0000 mg | CHEWABLE_TABLET | Freq: Two times a day (BID) | ORAL | 0 refills | Status: DC

## 2018-07-19 NOTE — Progress Notes (Signed)
At 1430, the pt was provided with d/c instructions. After discussing the pt's plan of care upon d/c home, the pt reported no further questions or concerns.   The pt is currently waiting for his front wheel walker to be delivered to the room. Once the walker is delivered, the pt can be d/c.

## 2018-07-19 NOTE — Discharge Instructions (Signed)
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
## 2018-07-19 NOTE — Progress Notes (Addendum)
Physical Therapy Treatment Patient Details Name: Allen Taylor MRN: [id number] DOB: 1946/06/04 Today's Date: 07/19/2018    History of Present Illness 73 yo male s/p L DA-THA on 07/17/18. PMH includes R BKA with prosthesis, OA, syncope, lumbar spinal stenosis with spinal fusion, HLD, hypokalema, anxiety, cancer, cataracts extraction.     PT Comments    Pt c/o L knee discomfort more than hip today.  He needs cues to slow down due to impulsivity at times.  He is moving at min/guard level.  He practiced stairs, but do to L ankle fusion and R prosthesis, he did not want to attempt descent with PT recommended way.  He states he will have wife with him.     Follow Up Recommendations  Follow surgeon's recommendation for DC plan and follow-up therapies;Supervision for mobility/OOB     Equipment Recommendations  Rolling walker with 5" wheels    Recommendations for Other Services       Precautions / Restrictions Precautions Precautions: Fall Restrictions Weight Bearing Restrictions: No    Mobility  Bed Mobility Overal bed mobility: Needs Assistance Bed Mobility: Supine to Sit     Supine to sit: Min assist     General bed mobility comments: Pt using bed sheet looped around end of bed to A with pulling his trunk upright. He says he plans on rigging something up like this at home.  Transfers Overall transfer level: Needs assistance Equipment used: Rolling walker (2 wheeled) Transfers: Sit to/from Stand Sit to Stand: Min guard         General transfer comment: for safety. pt is a bit impulsive  Ambulation/Gait Ambulation/Gait assistance: Min guard Gait Distance (Feet): 110 Feet(x2) Assistive device: Rolling walker (2 wheeled) Gait Pattern/deviations: Step-to pattern;Trunk flexed;Wide base of support Gait velocity: decr    General Gait Details: min/guard for safety and frequent cueing for upright posture and positioning of RW   Stairs Stairs: Yes Stairs assistance: Min  guard Stair Management: Two rails;Forwards Number of Stairs: 3 General stair comments: Pt did not follow PT's recommendation for descent sequencing. He would scoot L foot to front of stair and then go down with R leg then L.  He did not want to attempt the way PT recommended.   Wheelchair Mobility    Modified Rankin (Stroke Patients Only)       Balance     Sitting balance-Leahy Scale: Good Sitting balance - Comments: donned R LE prosthesis while sitting EOB.   Standing balance support: Bilateral upper extremity supported Standing balance-Leahy Scale: Fair Standing balance comment: fair static and poor dynamic                            Cognition Arousal/Alertness: Awake/alert Behavior During Therapy: WFL for tasks assessed/performed Overall Cognitive Status: Within Functional Limits for tasks assessed                                 General Comments: slightly impulsive at times      Exercises General Exercises - Lower Extremity Quad Sets: AROM;Left;10 reps Gluteal Sets: AROM;Both;10 reps Heel Slides: AAROM;Left;10 reps    General Comments        Pertinent Vitals/Pain Pain Assessment: Faces Faces Pain Scale: Hurts little more Pain Location: L knee Pain Descriptors / Indicators: Sore Pain Intervention(s): Limited activity within patient's tolerance;Monitored during session;Repositioned;Ice applied    Home Living  Prior Function            PT Goals (current goals can now be found in the care plan section) Acute Rehab PT Goals Patient Stated Goal: none stated  PT Goal Formulation: With patient Time For Goal Achievement: 07/24/18 Potential to Achieve Goals: Good Progress towards PT goals: Progressing toward goals    Frequency    7X/week      PT Plan Current plan remains appropriate    Co-evaluation              AM-PAC PT "6 Clicks" Mobility   Outcome Measure  Help needed turning from  your back to your side while in a flat bed without using bedrails?: A Little Help needed moving from lying on your back to sitting on the side of a flat bed without using bedrails?: A Little Help needed moving to and from a bed to a chair (including a wheelchair)?: A Little Help needed standing up from a chair using your arms (e.g., wheelchair or bedside chair)?: A Little Help needed to walk in hospital room?: A Little Help needed climbing 3-5 steps with a railing? : A Little 6 Click Score: 18    End of Session Equipment Utilized During Treatment: Gait belt Activity Tolerance: Patient tolerated treatment well Patient left: in chair;with call bell/phone within reach;with chair alarm set;with nursing/sitter in room Nurse Communication: Mobility status PT Visit Diagnosis: Other abnormalities of gait and mobility (R26.89);Difficulty in walking, not elsewhere classified (R26.2)     Time: 9485-4627 PT Time Calculation (min) (ACUTE ONLY): 25 min  Charges:  $Gait Training: 8-22 mins $Therapeutic Exercise: 8-22 mins                     Daijon Wenke L. Tamala Julian, [place] Pager [phone] 07/19/2018    Galen Manila 07/19/2018, 9:48 AM

## 2018-07-20 ENCOUNTER — Encounter (HOSPITAL_COMMUNITY): Payer: Self-pay | Admitting: Orthopaedic Surgery

## 2018-07-21 DIAGNOSIS — Z85828 Personal history of other malignant neoplasm of skin: Secondary | ICD-10-CM | POA: Diagnosis not present

## 2018-07-21 DIAGNOSIS — Z87891 Personal history of nicotine dependence: Secondary | ICD-10-CM | POA: Diagnosis not present

## 2018-07-21 DIAGNOSIS — Z471 Aftercare following joint replacement surgery: Secondary | ICD-10-CM | POA: Diagnosis not present

## 2018-07-21 DIAGNOSIS — M48061 Spinal stenosis, lumbar region without neurogenic claudication: Secondary | ICD-10-CM | POA: Diagnosis not present

## 2018-07-21 DIAGNOSIS — Z89511 Acquired absence of right leg below knee: Secondary | ICD-10-CM | POA: Diagnosis not present

## 2018-07-21 DIAGNOSIS — E789 Disorder of lipoprotein metabolism, unspecified: Secondary | ICD-10-CM | POA: Diagnosis not present

## 2018-07-24 ENCOUNTER — Other Ambulatory Visit: Payer: Self-pay | Admitting: Orthopaedic Surgery

## 2018-07-24 DIAGNOSIS — M48061 Spinal stenosis, lumbar region without neurogenic claudication: Secondary | ICD-10-CM | POA: Diagnosis not present

## 2018-07-24 DIAGNOSIS — Z85828 Personal history of other malignant neoplasm of skin: Secondary | ICD-10-CM | POA: Diagnosis not present

## 2018-07-24 DIAGNOSIS — E789 Disorder of lipoprotein metabolism, unspecified: Secondary | ICD-10-CM | POA: Diagnosis not present

## 2018-07-24 DIAGNOSIS — Z89511 Acquired absence of right leg below knee: Secondary | ICD-10-CM | POA: Diagnosis not present

## 2018-07-24 DIAGNOSIS — Z471 Aftercare following joint replacement surgery: Secondary | ICD-10-CM | POA: Diagnosis not present

## 2018-07-24 DIAGNOSIS — Z87891 Personal history of nicotine dependence: Secondary | ICD-10-CM | POA: Diagnosis not present

## 2018-07-27 ENCOUNTER — Other Ambulatory Visit (INDEPENDENT_AMBULATORY_CARE_PROVIDER_SITE_OTHER): Payer: Self-pay | Admitting: Orthopaedic Surgery

## 2018-07-27 ENCOUNTER — Other Ambulatory Visit (INDEPENDENT_AMBULATORY_CARE_PROVIDER_SITE_OTHER): Payer: Self-pay

## 2018-07-27 ENCOUNTER — Ambulatory Visit (HOSPITAL_COMMUNITY)
Admission: RE | Admit: 2018-07-27 | Discharge: 2018-07-27 | Disposition: A | Discharge status: Home / Self Care | Payer: Medicare Other | Source: Ambulatory Visit | Attending: Orthopaedic Surgery | Admitting: Orthopaedic Surgery

## 2018-07-27 ENCOUNTER — Other Ambulatory Visit: Payer: Self-pay | Admitting: Orthopaedic Surgery

## 2018-07-27 DIAGNOSIS — M79662 Pain in left lower leg: Secondary | ICD-10-CM | POA: Diagnosis not present

## 2018-07-27 MED ORDER — OXYCODONE HCL 5 MG PO TABS: 5.0000 mg | ORAL_TABLET | ORAL | 0 refills | Status: DC | PRN

## 2018-07-27 NOTE — Care Plan (Signed)
Patient called in to CM with new lower leg/calf pain. Swollen and painful foot. Sent to hospital for outpatient doppler study to r/o DVT.   Doppler negative. Patient notified. Encouraged to ice and elevate to reduce swelling. Will follow up with Dr. Ninfa Linden as planned on Thursday. MD aware of events.   Allen Taylor, [place]

## 2018-07-27 NOTE — Progress Notes (Signed)
Left lower extremity venous duplex has been completed. Negative for DVT. Results were given to Dr. Ninfa Linden.  07/27/18 11:13 AM Carlos Levering RVT

## 2018-07-28 DIAGNOSIS — Z471 Aftercare following joint replacement surgery: Secondary | ICD-10-CM | POA: Diagnosis not present

## 2018-07-28 DIAGNOSIS — E789 Disorder of lipoprotein metabolism, unspecified: Secondary | ICD-10-CM | POA: Diagnosis not present

## 2018-07-28 DIAGNOSIS — Z89511 Acquired absence of right leg below knee: Secondary | ICD-10-CM | POA: Diagnosis not present

## 2018-07-28 DIAGNOSIS — Z85828 Personal history of other malignant neoplasm of skin: Secondary | ICD-10-CM | POA: Diagnosis not present

## 2018-07-28 DIAGNOSIS — Z87891 Personal history of nicotine dependence: Secondary | ICD-10-CM | POA: Diagnosis not present

## 2018-07-28 DIAGNOSIS — M48061 Spinal stenosis, lumbar region without neurogenic claudication: Secondary | ICD-10-CM | POA: Diagnosis not present

## 2018-07-29 DIAGNOSIS — Z87891 Personal history of nicotine dependence: Secondary | ICD-10-CM | POA: Diagnosis not present

## 2018-07-29 DIAGNOSIS — E789 Disorder of lipoprotein metabolism, unspecified: Secondary | ICD-10-CM | POA: Diagnosis not present

## 2018-07-29 DIAGNOSIS — Z85828 Personal history of other malignant neoplasm of skin: Secondary | ICD-10-CM | POA: Diagnosis not present

## 2018-07-29 DIAGNOSIS — Z471 Aftercare following joint replacement surgery: Secondary | ICD-10-CM | POA: Diagnosis not present

## 2018-07-29 DIAGNOSIS — M48061 Spinal stenosis, lumbar region without neurogenic claudication: Secondary | ICD-10-CM | POA: Diagnosis not present

## 2018-07-29 DIAGNOSIS — Z89511 Acquired absence of right leg below knee: Secondary | ICD-10-CM | POA: Diagnosis not present

## 2018-07-30 ENCOUNTER — Ambulatory Visit (INDEPENDENT_AMBULATORY_CARE_PROVIDER_SITE_OTHER): Payer: Medicare Other | Admitting: Orthopaedic Surgery

## 2018-07-30 ENCOUNTER — Encounter (INDEPENDENT_AMBULATORY_CARE_PROVIDER_SITE_OTHER): Payer: Self-pay | Admitting: Orthopaedic Surgery

## 2018-07-30 DIAGNOSIS — Z96642 Presence of left artificial hip joint: Secondary | ICD-10-CM

## 2018-07-30 NOTE — Progress Notes (Signed)
The patient is 13 days status post a left total hip arthroplasty through direct anterior approach.  He is doing well overall.  He has been on aspirin twice daily.  He would have been having calf pain so we sent him for an ultrasound and it was negative for DVT.  He is someone who actually has a right below-knee amputation has a prosthetic leg.  He is already walking without an assistive device and not taking narcotics.  On exam his hip incision looks great.  Remove staples in place Steri-Strips.  I did drain 60 cc effusion/seroma from the soft tissue.  His leg lengths are equal which is difficult to ascertain based on his prosthetic leg.  He is having knee pain and numbness and tingling.  Again reassurance that he should continue to improve with time since he is doing so well and actually ahead of things.  He will go down to an aspirin once a day for a week and then can stop his aspirin.  He can drive from our standpoint.  All questions and concerns were answered and addressed.  We will see him back in 4 weeks to see how he is doing overall.

## 2018-07-31 DIAGNOSIS — Z471 Aftercare following joint replacement surgery: Secondary | ICD-10-CM | POA: Diagnosis not present

## 2018-07-31 DIAGNOSIS — Z85828 Personal history of other malignant neoplasm of skin: Secondary | ICD-10-CM | POA: Diagnosis not present

## 2018-07-31 DIAGNOSIS — E789 Disorder of lipoprotein metabolism, unspecified: Secondary | ICD-10-CM | POA: Diagnosis not present

## 2018-07-31 DIAGNOSIS — M48061 Spinal stenosis, lumbar region without neurogenic claudication: Secondary | ICD-10-CM | POA: Diagnosis not present

## 2018-07-31 DIAGNOSIS — Z89511 Acquired absence of right leg below knee: Secondary | ICD-10-CM | POA: Diagnosis not present

## 2018-07-31 DIAGNOSIS — Z87891 Personal history of nicotine dependence: Secondary | ICD-10-CM | POA: Diagnosis not present

## 2018-08-03 ENCOUNTER — Other Ambulatory Visit: Payer: Self-pay | Admitting: Orthopaedic Surgery

## 2018-08-10 ENCOUNTER — Other Ambulatory Visit: Payer: Self-pay | Admitting: Orthopaedic Surgery

## 2018-08-10 DIAGNOSIS — M25552 Pain in left hip: Secondary | ICD-10-CM | POA: Diagnosis not present

## 2018-08-10 DIAGNOSIS — R109 Unspecified abdominal pain: Secondary | ICD-10-CM | POA: Diagnosis not present

## 2018-08-10 DIAGNOSIS — M62830 Muscle spasm of back: Secondary | ICD-10-CM | POA: Diagnosis not present

## 2018-08-10 DIAGNOSIS — Z6831 Body mass index (BMI) 31.0-31.9, adult: Secondary | ICD-10-CM | POA: Diagnosis not present

## 2018-08-10 DIAGNOSIS — M545 Low back pain: Secondary | ICD-10-CM | POA: Diagnosis not present

## 2018-08-10 DIAGNOSIS — M48061 Spinal stenosis, lumbar region without neurogenic claudication: Secondary | ICD-10-CM | POA: Diagnosis not present

## 2018-08-12 ENCOUNTER — Ambulatory Visit (INDEPENDENT_AMBULATORY_CARE_PROVIDER_SITE_OTHER): Payer: Medicare Other | Admitting: Orthopaedic Surgery

## 2018-08-12 ENCOUNTER — Encounter (INDEPENDENT_AMBULATORY_CARE_PROVIDER_SITE_OTHER): Payer: Self-pay | Admitting: Orthopaedic Surgery

## 2018-08-12 ENCOUNTER — Ambulatory Visit (INDEPENDENT_AMBULATORY_CARE_PROVIDER_SITE_OTHER): Payer: Medicare Other

## 2018-08-12 DIAGNOSIS — M545 Low back pain, unspecified: Secondary | ICD-10-CM

## 2018-08-12 MED ORDER — DICLOFENAC SODIUM 75 MG PO TBEC: 75.0000 mg | DELAYED_RELEASE_TABLET | Freq: Two times a day (BID) | ORAL | 0 refills | Status: DC

## 2018-08-12 MED ORDER — CYCLOBENZAPRINE HCL 10 MG PO TABS: 10.0000 mg | ORAL_TABLET | Freq: Three times a day (TID) | ORAL | 1 refills | Status: DC | PRN
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
## 2018-08-18 ENCOUNTER — Other Ambulatory Visit: Payer: Self-pay | Admitting: Orthopaedic Surgery

## 2018-08-18 DIAGNOSIS — M545 Low back pain: Secondary | ICD-10-CM | POA: Diagnosis not present

## 2018-08-18 DIAGNOSIS — M4800 Spinal stenosis, site unspecified: Secondary | ICD-10-CM | POA: Diagnosis not present

## 2018-08-21 DIAGNOSIS — M545 Low back pain: Secondary | ICD-10-CM | POA: Diagnosis not present

## 2018-08-21 DIAGNOSIS — M4800 Spinal stenosis, site unspecified: Secondary | ICD-10-CM | POA: Diagnosis not present

## 2018-08-24 DIAGNOSIS — M4800 Spinal stenosis, site unspecified: Secondary | ICD-10-CM | POA: Diagnosis not present

## 2018-08-24 DIAGNOSIS — M545 Low back pain: Secondary | ICD-10-CM | POA: Diagnosis not present

## 2018-08-26 DIAGNOSIS — M545 Low back pain: Secondary | ICD-10-CM | POA: Diagnosis not present

## 2018-08-26 DIAGNOSIS — M4800 Spinal stenosis, site unspecified: Secondary | ICD-10-CM | POA: Diagnosis not present

## 2018-08-27 ENCOUNTER — Ambulatory Visit (INDEPENDENT_AMBULATORY_CARE_PROVIDER_SITE_OTHER): Payer: Medicare Other | Admitting: Orthopaedic Surgery

## 2018-08-27 ENCOUNTER — Encounter (INDEPENDENT_AMBULATORY_CARE_PROVIDER_SITE_OTHER): Payer: Self-pay | Admitting: Orthopaedic Surgery

## 2018-08-27 DIAGNOSIS — M545 Low back pain, unspecified: Secondary | ICD-10-CM

## 2018-08-27 DIAGNOSIS — Z96642 Presence of left artificial hip joint: Secondary | ICD-10-CM

## 2018-08-27 NOTE — Progress Notes (Signed)
The patient is following up but now about 6 weeks status post a left total hip arthroplasty.  Is doing much better overall with that hip.  He does have a below-knee prosthesis on the right side.  He is been dealing with sciatica on the right side we actually have been physical therapy due to his sciatica.  That is improving some with therapy.  He would like to continue therapy as well which I agree with.  He is walking without assistive device and overall he looks great for his age of 1 and having a left total hip and a right below-knee prosthesis.  On exam he tolerates me putting both hips through range of motion easily.  His left operative hip looks good.  He can cross his legs easily now.  He says he has no issues putting on his shoes and socks.  He still has the sciatic symptoms on the right side but is much improved.  I would have him continue therapy for least 2 more weeks.  We will see him back in 4 weeks to see how he is doing overall.  No x-rays will be needed.  If he still having issues with sciatica we will obtain an MRI.

## 2018-09-01 DIAGNOSIS — M545 Low back pain: Secondary | ICD-10-CM | POA: Diagnosis not present

## 2018-09-01 DIAGNOSIS — M4800 Spinal stenosis, site unspecified: Secondary | ICD-10-CM | POA: Diagnosis not present

## 2018-09-03 DIAGNOSIS — M545 Low back pain: Secondary | ICD-10-CM | POA: Diagnosis not present

## 2018-09-03 DIAGNOSIS — M4800 Spinal stenosis, site unspecified: Secondary | ICD-10-CM | POA: Diagnosis not present

## 2018-09-08 DIAGNOSIS — M4800 Spinal stenosis, site unspecified: Secondary | ICD-10-CM | POA: Diagnosis not present

## 2018-09-08 DIAGNOSIS — M545 Low back pain: Secondary | ICD-10-CM | POA: Diagnosis not present

## 2018-09-10 DIAGNOSIS — M545 Low back pain: Secondary | ICD-10-CM | POA: Diagnosis not present

## 2018-09-10 DIAGNOSIS — M4800 Spinal stenosis, site unspecified: Secondary | ICD-10-CM | POA: Diagnosis not present

## 2018-09-16 ENCOUNTER — Other Ambulatory Visit (INDEPENDENT_AMBULATORY_CARE_PROVIDER_SITE_OTHER): Payer: Self-pay | Admitting: Physician Assistant

## 2018-09-22 ENCOUNTER — Telehealth (INDEPENDENT_AMBULATORY_CARE_PROVIDER_SITE_OTHER): Payer: Self-pay | Admitting: Radiology

## 2018-09-22 NOTE — Telephone Encounter (Signed)
I called and r/s appt to 09/24/2018 @ 1045.  NO to COVID-19 questions

## 2018-09-24 ENCOUNTER — Ambulatory Visit (INDEPENDENT_AMBULATORY_CARE_PROVIDER_SITE_OTHER): Payer: Medicare Other | Admitting: Orthopaedic Surgery

## 2018-09-24 ENCOUNTER — Encounter (INDEPENDENT_AMBULATORY_CARE_PROVIDER_SITE_OTHER): Payer: Self-pay | Admitting: Orthopaedic Surgery

## 2018-09-24 ENCOUNTER — Other Ambulatory Visit: Payer: Self-pay

## 2018-09-24 DIAGNOSIS — Z96642 Presence of left artificial hip joint: Secondary | ICD-10-CM

## 2018-09-24 DIAGNOSIS — M545 Low back pain, unspecified: Secondary | ICD-10-CM

## 2018-09-24 NOTE — Progress Notes (Signed)
The patient is just past 2 months status post a left total hip arthroplasty.  He says he is very pleased overall.  He has been dealing with sciatica but physical therapy help with that.  He is someone who actually has a below-knee amputation on his opposite right side and does use a below-knee prosthesis.  He says he is absolutely pleased that we were able to get his leg lengths close to equal.  He feels a major difference.  He is walking without any type of assistive device.  He says he is able to get his shoes and socks on easily on the operative side.  He says he is able to get his prosthesis on his right side easily.  He said the sciatic symptoms have improved significantly.  On exam I can put both hips through internal X rotation with no difficulty at all.  His left knee is moving more normally as well.  He has negative straight leg raise.  At this point all questions concerns were answered and addressed.  We will see him back in 6 months.  At that visit I would like a standing low AP pelvis and a lateral of his left operative hip.

## 2018-09-25 ENCOUNTER — Telehealth (INDEPENDENT_AMBULATORY_CARE_PROVIDER_SITE_OTHER): Payer: Self-pay | Admitting: *Deleted

## 2018-09-25 NOTE — Telephone Encounter (Signed)
Ortho Bundle 60 day phone call completed. Surveys mailed to patient.

## 2018-09-25 NOTE — Care Plan (Signed)
RNCM called patient to check status. Was unable to meet with patient in office at 2 month follow up with MD yesterday, 09/24/18. Patient states he is very pleased with status after Left Ant. Hip replacement on 07/17/18. RNCM will be mailing surveys to patient with self addressed envelope to be returned. Patient very appreciative of all care. Reviewed follow up in 6 months with Dr. Ninfa Linden on 03/24/19. Patient reminded of how to contact RNCM with any further questions or needs.

## 2018-09-25 NOTE — Care Plan (Signed)
RNCM met with patient in office during appointment with MD for 6 week follow up. Overall patient reports he is pleased with progress since surgery. No complications noted. Patient did report some sciatica type symptoms and would like to continue with OPPT for another 2 weeks. MD in agreement. Follow up in 4 weeks on 09/24/18. RNCM will mail surveys to patient to be completed and mailed back.

## 2018-10-01 DIAGNOSIS — N390 Urinary tract infection, site not specified: Secondary | ICD-10-CM | POA: Diagnosis not present

## 2018-10-01 DIAGNOSIS — R309 Painful micturition, unspecified: Secondary | ICD-10-CM | POA: Diagnosis not present

## 2018-10-08 ENCOUNTER — Telehealth (INDEPENDENT_AMBULATORY_CARE_PROVIDER_SITE_OTHER): Payer: Self-pay | Admitting: *Deleted

## 2018-10-08 NOTE — Telephone Encounter (Signed)
Ortho Bundle documentation of 1 month surveys received via regular mail.

## 2018-10-16 DIAGNOSIS — Z1331 Encounter for screening for depression: Secondary | ICD-10-CM | POA: Diagnosis not present

## 2018-10-16 DIAGNOSIS — M545 Low back pain: Secondary | ICD-10-CM | POA: Diagnosis not present

## 2018-10-16 DIAGNOSIS — M25572 Pain in left ankle and joints of left foot: Secondary | ICD-10-CM | POA: Diagnosis not present

## 2018-10-16 DIAGNOSIS — R309 Painful micturition, unspecified: Secondary | ICD-10-CM | POA: Diagnosis not present

## 2018-10-21 ENCOUNTER — Telehealth (INDEPENDENT_AMBULATORY_CARE_PROVIDER_SITE_OTHER): Payer: Self-pay | Admitting: *Deleted

## 2018-10-21 NOTE — Care Plan (Signed)
RNCM called patient to check status for 90 day call and [employer] Survey. Patient reports he is doing very well and extremely pleased with progress post-hip replacement. Reminder of appointment scheduled in September and is aware to contact office with any further questions or concerns.

## 2018-10-21 NOTE — Telephone Encounter (Signed)
90 day Ortho Bundle call and survey completed.

## 2018-11-10 DIAGNOSIS — H0102A Squamous blepharitis right eye, upper and lower eyelids: Secondary | ICD-10-CM | POA: Diagnosis not present

## 2018-11-10 DIAGNOSIS — H31092 Other chorioretinal scars, left eye: Secondary | ICD-10-CM | POA: Diagnosis not present

## 2018-11-10 DIAGNOSIS — Z961 Presence of intraocular lens: Secondary | ICD-10-CM | POA: Diagnosis not present

## 2018-11-10 DIAGNOSIS — H0102B Squamous blepharitis left eye, upper and lower eyelids: Secondary | ICD-10-CM | POA: Diagnosis not present

## 2018-12-21 DIAGNOSIS — H33322 Round hole, left eye: Secondary | ICD-10-CM | POA: Diagnosis not present

## 2018-12-21 DIAGNOSIS — H43392 Other vitreous opacities, left eye: Secondary | ICD-10-CM | POA: Diagnosis not present

## 2018-12-21 DIAGNOSIS — H43811 Vitreous degeneration, right eye: Secondary | ICD-10-CM | POA: Diagnosis not present

## 2018-12-21 DIAGNOSIS — H33302 Unspecified retinal break, left eye: Secondary | ICD-10-CM | POA: Diagnosis not present

## 2018-12-23 DIAGNOSIS — H33322 Round hole, left eye: Secondary | ICD-10-CM | POA: Diagnosis not present

## 2019-02-22 DIAGNOSIS — R739 Hyperglycemia, unspecified: Secondary | ICD-10-CM | POA: Diagnosis not present

## 2019-02-22 DIAGNOSIS — D72829 Elevated white blood cell count, unspecified: Secondary | ICD-10-CM | POA: Diagnosis not present

## 2019-02-22 DIAGNOSIS — R82998 Other abnormal findings in urine: Secondary | ICD-10-CM | POA: Diagnosis not present

## 2019-02-22 DIAGNOSIS — Z20818 Contact with and (suspected) exposure to other bacterial communicable diseases: Secondary | ICD-10-CM | POA: Diagnosis not present

## 2019-02-22 DIAGNOSIS — E7849 Other hyperlipidemia: Secondary | ICD-10-CM | POA: Diagnosis not present

## 2019-02-22 DIAGNOSIS — Z125 Encounter for screening for malignant neoplasm of prostate: Secondary | ICD-10-CM | POA: Diagnosis not present

## 2019-03-02 DIAGNOSIS — D509 Iron deficiency anemia, unspecified: Secondary | ICD-10-CM | POA: Diagnosis not present

## 2019-03-02 DIAGNOSIS — R309 Painful micturition, unspecified: Secondary | ICD-10-CM | POA: Diagnosis not present

## 2019-03-02 DIAGNOSIS — Z1339 Encounter for screening examination for other mental health and behavioral disorders: Secondary | ICD-10-CM | POA: Diagnosis not present

## 2019-03-02 DIAGNOSIS — M545 Low back pain: Secondary | ICD-10-CM | POA: Diagnosis not present

## 2019-03-02 DIAGNOSIS — E785 Hyperlipidemia, unspecified: Secondary | ICD-10-CM | POA: Diagnosis not present

## 2019-03-02 DIAGNOSIS — F419 Anxiety disorder, unspecified: Secondary | ICD-10-CM | POA: Diagnosis not present

## 2019-03-02 DIAGNOSIS — M79606 Pain in leg, unspecified: Secondary | ICD-10-CM | POA: Diagnosis not present

## 2019-03-02 DIAGNOSIS — D72829 Elevated white blood cell count, unspecified: Secondary | ICD-10-CM | POA: Diagnosis not present

## 2019-03-02 DIAGNOSIS — R739 Hyperglycemia, unspecified: Secondary | ICD-10-CM | POA: Diagnosis not present

## 2019-03-02 DIAGNOSIS — Z0000 Encounter for general adult medical examination without abnormal findings: Secondary | ICD-10-CM | POA: Diagnosis not present

## 2019-03-10 DIAGNOSIS — D509 Iron deficiency anemia, unspecified: Secondary | ICD-10-CM | POA: Diagnosis not present

## 2019-03-12 ENCOUNTER — Encounter: Payer: Self-pay | Admitting: Gastroenterology

## 2019-03-16 DIAGNOSIS — Z1212 Encounter for screening for malignant neoplasm of rectum: Secondary | ICD-10-CM | POA: Diagnosis not present

## 2019-03-24 ENCOUNTER — Ambulatory Visit (INDEPENDENT_AMBULATORY_CARE_PROVIDER_SITE_OTHER): Payer: Medicare Other | Admitting: Orthopaedic Surgery

## 2019-03-24 ENCOUNTER — Ambulatory Visit (INDEPENDENT_AMBULATORY_CARE_PROVIDER_SITE_OTHER): Payer: Medicare Other

## 2019-03-24 ENCOUNTER — Encounter: Payer: Self-pay | Admitting: Orthopaedic Surgery

## 2019-03-24 DIAGNOSIS — Z96642 Presence of left artificial hip joint: Secondary | ICD-10-CM

## 2019-03-24 NOTE — Progress Notes (Signed)
The patient is a very pleasant 73 year old gentleman who is 8 months status post a left total hip arthroplasty.  He says he is doing great and has no problems with his hip at all.  He feels like he is balanced.  He has no right hip issues.  He does have a right below-knee amputation with a prosthesis.  He feels like he is out to his good working length.  He has been able to do yard work and anything else that he wants.  He is very active.  On examination both hips move smoothly.  They move the same.  His left operative hip has no issues at all.  An AP pelvis and lateral of the left hip shows a well-seated total hip arthroplasty with no complicating features.  At this point follow-up can be as needed.  All question concerns were answered addressed.  If there is any issues at all he knows to let us know and come see [place].

## 2019-04-13 ENCOUNTER — Other Ambulatory Visit: Payer: Self-pay

## 2019-04-13 ENCOUNTER — Encounter: Payer: Self-pay | Admitting: Gastroenterology

## 2019-04-13 ENCOUNTER — Ambulatory Visit (INDEPENDENT_AMBULATORY_CARE_PROVIDER_SITE_OTHER): Payer: Medicare Other | Admitting: Gastroenterology

## 2019-04-13 VITALS — BP 140/80 | HR 64 | Temp 97.4°F | Ht 65.0 in | Wt 192.0 lb

## 2019-04-13 DIAGNOSIS — D500 Iron deficiency anemia secondary to blood loss (chronic): Secondary | ICD-10-CM

## 2019-04-13 DIAGNOSIS — R195 Other fecal abnormalities: Secondary | ICD-10-CM | POA: Diagnosis not present

## 2019-04-13 MED ORDER — NA SULFATE-K SULFATE-MG SULF 17.5-3.13-1.6 GM/177ML PO SOLN: 1.0000 | Freq: Once | ORAL | 0 refills | Status: AC
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
## 2019-04-13 NOTE — Patient Instructions (Addendum)
If you are age 75 or older, your body mass index should be between 23-30. Your Body mass index is 31.95 kg/m. If this is out of the aforementioned range listed, please consider follow up with your Primary Care Provider.  If you are age 76 or younger, your body mass index should be between 19-25. Your Body mass index is 31.95 kg/m. If this is out of the aformentioned range listed, please consider follow up with your Primary Care Provider.   You have been scheduled for a colonoscopy. Please follow written instructions given to you at your visit today.  Please pick up your prep supplies at the pharmacy within the next 1-3 days. If you use inhalers (even only as needed), please bring them with you on the day of your procedure. Your physician has requested that you go to www.startemmi.com and enter the access code given to you at your visit today. This web site gives a general overview about your procedure. However, you should still follow specific instructions given to you by our office regarding your preparation for the procedure.  It was a pleasure to see you today!  Dr. Loletha Carrow

## 2019-04-21 DIAGNOSIS — H35412 Lattice degeneration of retina, left eye: Secondary | ICD-10-CM | POA: Diagnosis not present

## 2019-04-21 DIAGNOSIS — H43392 Other vitreous opacities, left eye: Secondary | ICD-10-CM | POA: Diagnosis not present

## 2019-04-21 DIAGNOSIS — H33302 Unspecified retinal break, left eye: Secondary | ICD-10-CM | POA: Diagnosis not present

## 2019-04-21 DIAGNOSIS — H43811 Vitreous degeneration, right eye: Secondary | ICD-10-CM | POA: Diagnosis not present

## 2019-04-26 ENCOUNTER — Encounter: Payer: Self-pay | Admitting: Gastroenterology

## 2019-04-26 ENCOUNTER — Other Ambulatory Visit: Payer: Self-pay

## 2019-04-26 ENCOUNTER — Ambulatory Visit (AMBULATORY_SURGERY_CENTER): Payer: Medicare Other | Admitting: Gastroenterology

## 2019-04-26 VITALS — BP 128/73 | HR 57 | Temp 93.7°F | Resp 14 | Ht 65.0 in | Wt 192.0 lb

## 2019-04-26 DIAGNOSIS — D500 Iron deficiency anemia secondary to blood loss (chronic): Secondary | ICD-10-CM | POA: Diagnosis not present

## 2019-04-26 DIAGNOSIS — R195 Other fecal abnormalities: Secondary | ICD-10-CM | POA: Diagnosis not present

## 2019-04-26 DIAGNOSIS — D123 Benign neoplasm of transverse colon: Secondary | ICD-10-CM | POA: Diagnosis not present

## 2019-04-26 DIAGNOSIS — Z1211 Encounter for screening for malignant neoplasm of colon: Secondary | ICD-10-CM | POA: Diagnosis not present

## 2019-04-26 MED ORDER — SODIUM CHLORIDE 0.9 % IV SOLN: 500.0000 mL | Freq: Once | INTRAVENOUS | Status: DC

## 2019-04-26 NOTE — Progress Notes (Signed)
Called to room to assist during endoscopic procedure.  Patient ID and intended procedure confirmed with present staff. Received instructions for my participation in the procedure from the performing physician.  

## 2019-04-26 NOTE — Op Note (Signed)
[facility] Patient Name: Allen Taylor Procedure Date: 04/26/2019 9:31 AM MRN: [id number] Endoscopist: Saunemin. 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 The patient's tolerance of                            previous anesthesia was also reviewed. The risks                            and benefits of the procedure and the sedation                            options and risks were discussed with the patient.                            All questions were answered, and informed consent                            was obtained. Prior Anticoagulants: The patient has                            taken no previous anticoagulant or antiplatelet                            agents except for aspirin. ASA Grade Assessment: II                            -  A patient with mild systemic disease. After                            reviewing the risks and benefits, the patient was                            deemed in satisfactory condition to undergo the                            procedure.                           After obtaining informed consent, the colonoscope                            was passed under direct vision. Throughout the     procedure, the patient's blood pressure, pulse, and                            oxygen saturations were monitored continuously. The                            Colonoscope was introduced through the anus and                            advanced to the the terminal ileum, with                            identification of the appendiceal orifice and IC                            valve. The colonoscopy was performed without                            difficulty. The patient tolerated the procedure                            well. The quality of the bowel preparation was                            good. The terminal ileum, ileocecal valve,                            appendiceal orifice, and rectum were photographed. Scope In: 9:43:50 AM Scope Out: 9:56:27 AM Scope Withdrawal Time: 0 hours 9 minutes 44 seconds  Total Procedure Duration: 0 hours 12 minutes 37 seconds  Findings:                 The perianal and digital rectal examinations were                            normal.                           Multiple diverticula were found in the sigmoid  colon.                           A 6 mm polyp was found in the transverse colon. The                            polyp was flat. The polyp was removed with a cold                            snare. Resection and retrieval were complete.                           The exam was otherwise without abnormality on                            direct and retroflexion views. Complications:            No immediate complications. Estimated Blood Loss:     Estimated blood loss was minimal. Impression:               - Diverticulosis in the sigmoid colon.                           - One 6 mm polyp in the transverse colon, removed                            with a cold snare. Resected and retrieved.                           - The examination was otherwise normal on direct                            and retroflexion views.                            IDA and Heme positive stool suspected to be from                            known small bowel ulcers. Recommendation:           - Patient has a contact number available for                            emergencies. The signs and symptoms of potential                            delayed complications were discussed with the                            patient. Return to normal activities tomorrow.                            Written discharge instructions were provided to the  patient.                           - Resume previous diet.                           - Continue present medications.                           - Await pathology results.                           - Use NSAID sparingly - patient reports it is                            necessary to maintain functional status due to                            arthritis.                           - Based on current guidelines, no repeat polyp                            surveillance colonoscopy.                           - Return to primary care physician to follow                            hemoglobin and dose iron accordingly. Henry L. Loletha Carrow, MD 04/26/2019 10:06:28 AM This report has been signed electronically.

## 2019-04-26 NOTE — Progress Notes (Signed)
Pt's states no medical or surgical changes since previsit or office visit. 

## 2019-04-26 NOTE — Progress Notes (Signed)
A and O x3. Report to RN. Tolerated MAC anesthesia well.

## 2019-04-26 NOTE — Patient Instructions (Signed)
Please read handouts provided. Continue present medications. Await pathology results. Use NSAID sparingly.      YOU HAD AN ENDOSCOPIC PROCEDURE TODAY AT [facility]:   Refer to the procedure report that was given to you for any specific questions about what was found during the examination.  If the procedure report does not answer your questions, please call your gastroenterologist to clarify.  If you requested that your care partner not be given the details of your procedure findings, then the procedure report has been included in a sealed envelope for you to review at your convenience later.  YOU SHOULD EXPECT: Some feelings of bloating in the abdomen. Passage of more gas than usual.  Walking can help get rid of the air that was put into your GI tract during the procedure and reduce the bloating. If you had a lower endoscopy (such as a colonoscopy or flexible sigmoidoscopy) you may notice spotting of blood in your stool or on the toilet paper. If you underwent a bowel prep for your procedure, you may not have a normal bowel movement for a few days.  Please Note:  You might notice some irritation and congestion in your nose or some drainage.  This is from the oxygen used during your procedure.  There is no need for concern and it should clear up in a day or so.  SYMPTOMS TO REPORT IMMEDIATELY:   Following lower endoscopy (colonoscopy or flexible sigmoidoscopy):  Excessive amounts of blood in the stool  Significant tenderness or worsening of abdominal pains  Swelling of the abdomen that is new, acute  Fever of 100F or higher   For urgent or emergent issues, a gastroenterologist can be reached at any hour by calling [phone].   DIET:  We do recommend a small meal at first, but then you may proceed to your regular diet.  Drink plenty of fluids but you should avoid alcoholic beverages for 24 hours.  ACTIVITY:  You should plan to take it easy for the rest of today  and you should NOT DRIVE or use heavy machinery until tomorrow (because of the sedation medicines used during the test).    FOLLOW UP: Our staff will call the number listed on your records 48-72 hours following your procedure to check on you and address any questions or concerns that you may have regarding the information given to you following your procedure. If we do not reach you, we will leave a message.  We will attempt to reach you two times.  During this call, we will ask if you have developed any symptoms of COVID 19. If you develop any symptoms (ie: fever, flu-like symptoms, shortness of breath, cough etc.) before then, please call [phone].  If you test positive for Covid 19 in the 2 weeks post procedure, please call and report this information to [place].    If any biopsies were taken you will be contacted by phone or by letter within the next 1-3 weeks.  Please call us at [phone] if you have not heard about the biopsies in 3 weeks.    SIGNATURES/CONFIDENTIALITY: You and/or your care partner have signed paperwork which will be entered into your electronic medical record.  These signatures attest to the fact that that the information above on your After Visit Summary has been reviewed and is understood.  Full responsibility of the confidentiality of this discharge information lies with you and/or your care-partner.

## 2019-04-28 ENCOUNTER — Telehealth: Payer: Self-pay

## 2019-04-28 NOTE — Telephone Encounter (Signed)
  Follow up Call-  Call back number 04/26/2019  Post procedure Call Back phone  # [phone]  Permission to leave phone message Yes  Some recent data might be hidden     Patient questions:  Do you have a fever, pain , or abdominal swelling? No. Pain Score  0 *  Have you tolerated food without any problems? Yes.    Have you been able to return to your normal activities? Yes.    Do you have any questions about your discharge instructions: Diet   No. Medications  No. Follow up visit  No.  Do you have questions or concerns about your Care? No.  Actions: * If pain score is 4 or above: No action needed, pain <4.  1. Have you developed a fever since your procedure? no  2.   Have you had an respiratory symptoms (SOB or cough) since your procedure? no  3.   Have you tested positive for COVID 19 since your procedure no  4.   Have you had any family members/close contacts diagnosed with the COVID 19 since your procedure?  no   If yes to any of these questions please route to Joylene John, RN and Alphonsa Gin, Therapist, sports.

## 2019-04-29 ENCOUNTER — Encounter: Payer: Self-pay | Admitting: Gastroenterology

## 2019-07-12 DIAGNOSIS — M545 Low back pain: Secondary | ICD-10-CM | POA: Diagnosis not present

## 2019-07-12 DIAGNOSIS — Z1389 Encounter for screening for other disorder: Secondary | ICD-10-CM | POA: Diagnosis not present

## 2019-07-12 DIAGNOSIS — D649 Anemia, unspecified: Secondary | ICD-10-CM | POA: Diagnosis not present

## 2019-07-12 DIAGNOSIS — M25572 Pain in left ankle and joints of left foot: Secondary | ICD-10-CM | POA: Diagnosis not present

## 2019-07-15 DIAGNOSIS — Z23 Encounter for immunization: Secondary | ICD-10-CM | POA: Diagnosis not present

## 2019-07-15 DIAGNOSIS — D649 Anemia, unspecified: Secondary | ICD-10-CM | POA: Diagnosis not present

## 2019-07-15 DIAGNOSIS — R739 Hyperglycemia, unspecified: Secondary | ICD-10-CM | POA: Diagnosis not present

## 2019-07-27 ENCOUNTER — Telehealth: Payer: Self-pay | Admitting: *Deleted

## 2019-07-27 NOTE — Care Plan (Signed)
RNCM 1 year Ortho bundle call completed. Patient reports he is extremely pleased with his hip at 1 year post replacement. Doing extremely well and no concerns. Reviewed Cyndee Brightly. Survey.

## 2019-07-27 NOTE — Telephone Encounter (Signed)
Ortho bundle 1 year call and survey completed. 

## 2019-08-08 ENCOUNTER — Ambulatory Visit: Payer: Medicare Other | Attending: Internal Medicine

## 2019-08-08 DIAGNOSIS — Z23 Encounter for immunization: Secondary | ICD-10-CM | POA: Insufficient documentation

## 2019-08-08 NOTE — Progress Notes (Signed)
   Covid-19 Vaccination Clinic  Name:  BRIX HAJEK    MRN: [id number] DOB: Jul 14, 1945  08/08/2019  Mr. Harwell was observed post Covid-19 immunization for 15 minutes without incidence. He was provided with Vaccine Information Sheet and instruction to access the V-Safe system.   Mr. Stracke was instructed to call 911 with any severe reactions post vaccine: Marland Kitchen Difficulty breathing  . Swelling of your face and throat  . A fast heartbeat  . A bad rash all over your body  . Dizziness and weakness    Immunizations Administered    Name Date Dose VIS Date Route   Pfizer COVID-19 Vaccine 08/08/2019  2:50 PM 0.3 mL 06/11/2019 Intramuscular   Manufacturer: [place]   Lot: [id number]   [place]: [id number]

## 2019-08-13 ENCOUNTER — Ambulatory Visit: Payer: Medicare Other | Attending: Internal Medicine

## 2019-08-13 DIAGNOSIS — Z23 Encounter for immunization: Secondary | ICD-10-CM

## 2019-08-13 DIAGNOSIS — Z20822 Contact with and (suspected) exposure to covid-19: Secondary | ICD-10-CM | POA: Diagnosis not present

## 2019-08-14 LAB — NOVEL CORONAVIRUS, NAA: SARS-CoV-2, NAA: NOT DETECTED

## 2019-09-02 ENCOUNTER — Ambulatory Visit: Payer: Medicare Other | Attending: Internal Medicine

## 2019-09-02 DIAGNOSIS — Z23 Encounter for immunization: Secondary | ICD-10-CM | POA: Insufficient documentation

## 2019-09-02 NOTE — Progress Notes (Signed)
   Covid-19 Vaccination Clinic  Name:  Allen Taylor    MRN: [id number] DOB: 1945/08/16  09/02/2019  [name] was observed post Covid-19 immunization for 15 minutes without incident. He was provided with Vaccine Information Sheet and instruction to access the V-Safe system.   [name] was instructed to call 911 with any severe reactions post vaccine: Marland Kitchen Difficulty breathing  . Swelling of face and throat  . A fast heartbeat  . A bad rash all over body  . Dizziness and weakness   Immunizations Administered    Name Date Dose VIS Date Route   Pfizer COVID-19 Vaccine 09/02/2019 12:04 PM 0.3 mL 06/11/2019 Intramuscular   Manufacturer: [place]   Lot: [id number]   [place]: [id number]

## 2019-11-15 DIAGNOSIS — M542 Cervicalgia: Secondary | ICD-10-CM | POA: Diagnosis not present

## 2019-11-25 ENCOUNTER — Telehealth: Payer: Self-pay | Admitting: Physical Medicine and Rehabilitation

## 2019-11-25 NOTE — Telephone Encounter (Signed)
Patient called.   Requesting a call back to see if he can be seen sooner than 11/30/19  Call back: [phone]

## 2019-11-25 NOTE — Telephone Encounter (Signed)
Called patient to advise that we will be out of the office tomorrow and Monday, so he has our next appointment .

## 2019-11-30 ENCOUNTER — Ambulatory Visit (INDEPENDENT_AMBULATORY_CARE_PROVIDER_SITE_OTHER): Payer: Medicare Other | Admitting: Physical Medicine and Rehabilitation

## 2019-11-30 ENCOUNTER — Encounter: Payer: Self-pay | Admitting: Physical Medicine and Rehabilitation

## 2019-11-30 ENCOUNTER — Other Ambulatory Visit: Payer: Self-pay

## 2019-11-30 VITALS — BP 143/90 | HR 65 | Ht 66.0 in | Wt 198.0 lb

## 2019-11-30 DIAGNOSIS — F1190 Opioid use, unspecified, uncomplicated: Secondary | ICD-10-CM

## 2019-11-30 DIAGNOSIS — M25511 Pain in right shoulder: Secondary | ICD-10-CM

## 2019-11-30 DIAGNOSIS — G8929 Other chronic pain: Secondary | ICD-10-CM

## 2019-11-30 DIAGNOSIS — M542 Cervicalgia: Secondary | ICD-10-CM

## 2019-11-30 DIAGNOSIS — G894 Chronic pain syndrome: Secondary | ICD-10-CM

## 2019-11-30 DIAGNOSIS — M25512 Pain in left shoulder: Secondary | ICD-10-CM

## 2019-11-30 DIAGNOSIS — M5412 Radiculopathy, cervical region: Secondary | ICD-10-CM | POA: Diagnosis not present

## 2019-11-30 NOTE — Progress Notes (Signed)
Pt states pain in the back of the head and neck that radiates into the top of both shoulders. Pt states pain started Nov 04, 2019. Pt states daily activities especially bending makes pain worse. Pt states prednisone helps with pain.   .Numeric Pain Rating Scale and Functional Assessment Average Pain 7 Pain Right Now 5 My pain is constant and dull Pain is worse with: bending and some activites Pain improves with: medication   In the last MONTH (on 0-10 scale) has pain interfered with the following?  1. General activity like being  able to carry out your everyday physical activities such as walking, climbing stairs, carrying groceries, or moving a chair?  Rating(8)  2. Relation with others like being able to carry out your usual social activities and roles such as  activities at home, at work and in your community. Rating(8)  3. Enjoyment of life such that you have  been bothered by emotional problems such as feeling anxious, depressed or irritable?  Rating(4)

## 2019-12-01 ENCOUNTER — Encounter: Payer: Self-pay | Admitting: Physical Medicine and Rehabilitation

## 2019-12-01 MED ORDER — LIDOCAINE HCL 1 % IJ SOLN
3.0000 mL | INTRAMUSCULAR | Status: AC | PRN
  Administered 2019-11-30: 3 mL

## 2019-12-01 MED ORDER — TRIAMCINOLONE ACETONIDE 40 MG/ML IJ SUSP
20.0000 mg | INTRAMUSCULAR | Status: AC | PRN
  Administered 2019-11-30: 20 mg via INTRAMUSCULAR
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
## 2019-12-09 ENCOUNTER — Other Ambulatory Visit: Payer: Self-pay

## 2019-12-09 ENCOUNTER — Ambulatory Visit (INDEPENDENT_AMBULATORY_CARE_PROVIDER_SITE_OTHER): Payer: Medicare Other | Admitting: Physical Medicine and Rehabilitation

## 2019-12-09 ENCOUNTER — Ambulatory Visit: Payer: Self-pay

## 2019-12-09 ENCOUNTER — Encounter: Payer: Self-pay | Admitting: Physical Medicine and Rehabilitation

## 2019-12-09 VITALS — BP 120/79 | HR 53

## 2019-12-09 DIAGNOSIS — M47812 Spondylosis without myelopathy or radiculopathy, cervical region: Secondary | ICD-10-CM | POA: Diagnosis not present

## 2019-12-09 DIAGNOSIS — M542 Cervicalgia: Secondary | ICD-10-CM

## 2019-12-09 DIAGNOSIS — M4712 Other spondylosis with myelopathy, cervical region: Secondary | ICD-10-CM

## 2019-12-09 MED ORDER — METHYLPREDNISOLONE ACETATE 80 MG/ML IJ SUSP
40.0000 mg | Freq: Once | INTRAMUSCULAR | Status: AC
  Administered 2019-12-09: 40 mg

## 2019-12-09 NOTE — Progress Notes (Signed)
Pt states pain in the neck and radiates into both shoulders. Pt states no major changes since last visit 11/30/19. Pt states trigger point helped out a lot.   .Numeric Pain Rating Scale and Functional Assessment Average Pain 5   In the last MONTH (on 0-10 scale) has pain interfered with the following?  1. General activity like being  able to carry out your everyday physical activities such as walking, climbing stairs, carrying groceries, or moving a chair?  Rating(7)   +Driver, -BT, -Dye Allergies.

## 2019-12-10 ENCOUNTER — Telehealth: Payer: Self-pay | Admitting: *Deleted

## 2019-12-10 NOTE — Telephone Encounter (Signed)
Received pain diary and called pt, he states he is much better post facet injection. Advised pt once pain returns give [place] a call back to schedule his second facet injection.

## 2019-12-13 NOTE — Progress Notes (Signed)
Allen Taylor - 74 y.o. male MRN [id number]  Date of birth: [birth year]  Office Visit Note: Visit Date: 12/09/2019 PCP: Leanna Battles, MD Referred by: Leanna Battles, MD  Subjective: Chief Complaint  Patient presents with  . Neck - Pain  . Right Shoulder - Pain  . Left Shoulder - Pain   HPI:  Allen Taylor is a 74 y.o. male who comes in today for planned Bilateral C2-3 cervical facet/medial branch block with fluoroscopic guidance.  The patient has failed conservative care including home exercise, medications, time and activity modification.  This injection will be diagnostic and hopefully therapeutic.  Please see requesting physician notes for further details and justification.    ROS Otherwise per HPI.  Assessment & Plan: Visit Diagnoses:  1. Cervicalgia   2. Other spondylosis with myelopathy, cervical region     Plan: No additional findings.   Meds & Orders:  Meds ordered this encounter  Medications  . methylPREDNISolone acetate (DEPO-MEDROL) injection 40 mg    Orders Placed This Encounter  Procedures  . Facet Injection  . XR C-ARM NO REPORT    Follow-up: Return for Review Pain Diary.   Procedures: No procedures performed  Diagnostic Cervical Facet Joint Nerve Block with Fluoroscopic Guidance  Patient: Allen Taylor      Date of Birth: 06-Aug-1945 MRN: [id number] PCP: Leanna Battles, MD      Visit Date: 12/09/2019   Universal Protocol:    Date/Time: 06/14/216:14 AM  Consent Given By: the patient  Position: PRONE  Additional Comments: Vital signs were monitored before and after the procedure. Patient was prepped and draped in the usual sterile fashion. The correct patient, procedure, and site was verified.   Injection Procedure Details:  Procedure Site One Meds Administered:  Meds ordered this encounter  Medications  . methylPREDNISolone acetate (DEPO-MEDROL) injection 40 mg     Laterality: Bilateral  Location/Site:  C2-3  Needle  size: 25 G  Needle type: Spinal  Needle Placement: Articular Pillar  Findings:  -Contrast Used: 0.5 mL iohexol 180 mg iodine/mL   -Comments: Excellent flow of contrast across the articular pillars without intravascular flow  Procedure Details: The fluoroscope beam was positioned to square off the endplates of the desired vertebral level to achieve a true AP position. The beam was then moved in a small "counter" oblique to the contralateral side with a small amount of caudal tilt to achieve a trajectory alignment with the desired nerves.  For each target described below the skin was anesthetized with 1 ml of 1% Lidocaine without epinephrine.   To block the facet joint nerve to C2, the needle was fluoroscopically positioned over the inferior lateral portion of the C2/3 facet joint nerve where the third occipital nerve (TON) lies.  After contact with periosteum and negative aspirate for blood and CSF, correct placement without intravascular or epidural spread was confirmed by Bi-planar images and  injecting 0.5 ml. of Omnipaque-240.  A spot radiograph was obtained of this image.  Next, a 0.5 ml. volume of 1% Lidocaine without Epinephrine was then injected.  Prior to the procedure, the patient was given a Pain Diary which was completed for baseline measurements.  After the procedure, the patient rated their pain every 30 minutes and will continue rating at this frequency for a total of 5 hours.  The patient has been asked to complete the Diary and return to [place] by mail, fax or hand delivered as soon as possible.   Additional Comments:  The  patient tolerated the procedure well Dressing: Band-Aid    Post-procedure details: Patient was observed during the procedure. Post-procedure instructions were reviewed.  Patient left the clinic in stable condition.       Clinical History: MRI CERVICAL SPINE WITHOUT AND WITH CONTRAST  TECHNIQUE: Multiplanar and multiecho pulse sequences of the  cervical spine, to include the craniocervical junction and cervicothoracic junction, were obtained without and with intravenous contrast.  CONTRAST:  58mL MULTIHANCE GADOBENATE DIMEGLUMINE 529 MG/ML IV SOLN  COMPARISON:  None.  FINDINGS: Alignment: Grade 1 anterolisthesis C5-6 and grade 1 retrolisthesis at C6-7.  Vertebrae: No fracture, evidence of discitis, or bone lesion.  Cord: Normal signal and morphology.  Posterior Fossa, vertebral arteries, paraspinal tissues: Negative.  Disc levels:  C1-C2: Normal.  C2-C3: Small disc bulge with moderate bilateral neural foraminal stenosis. No spinal canal stenosis.  C3-C4: Right-greater-than-left uncovertebral hypertrophy and facet hypertrophy with moderate narrowing of the right neural foramen. No spinal canal or left foraminal stenosis.  C4-C5: Disc bulge and left-greater-than-right facet hypertrophy with mild uncovertebral spurring. Mild left neural foraminal stenosis.  C5-C6: Bilateral facet hypertrophy and small disc osteophyte complex. Mild left foraminal stenosis. No spinal canal stenosis.  C6-C7: Medium-sized central disc protrusion indents the ventral aspect of the spinal cord. No spinal canal stenosis. Moderate left neural foraminal stenosis.  C7-T1: Normal disc space and facets. No spinal canal or neuroforaminal stenosis.  No abnormal contrast enhancement.  IMPRESSION: 1. Central disc protrusion at C6-C7 indents the ventral spinal cord without causing central spinal canal stenosis. Moderate left foraminal stenosis also at this level. 2. Moderate right C3-C4 neural foraminal stenosis due to combination of uncovertebral and facet hypertrophy. 3. Mild left foraminal stenosis at C4-5 and C5-6.   Electronically Signed   By: Ulyses Jarred M.D.   On: 12/27/2016 16:05     Objective:  VS:  HT:    WT:   BMI:     BP:120/79  HR:(!) 53bpm  TEMP: ( )  RESP:  Physical Exam Vitals and nursing  note reviewed.  Constitutional:      General: He is not in acute distress.    Appearance: Normal appearance. He is not ill-appearing.  HENT:     Head: Normocephalic and atraumatic.     Right Ear: External ear normal.     Left Ear: External ear normal.  Eyes:     Extraocular Movements: Extraocular movements intact.  Cardiovascular:     Rate and Rhythm: Normal rate.     Pulses: Normal pulses.  Abdominal:     General: There is no distension.     Palpations: Abdomen is soft.  Musculoskeletal:        General: No signs of injury.     Cervical back: Neck supple. Tenderness present. No rigidity.     Comments: Patient has good strength in the upper extremities with 5 out of 5 strength in wrist extension long finger flexion APB.  No intrinsic hand muscle atrophy.  Negative Hoffmann's test.  Lymphadenopathy:     Cervical: No cervical adenopathy.  Skin:    General: Skin is warm and dry.     Findings: No erythema or rash.  Neurological:     General: No focal deficit present.     Mental Status: He is alert and oriented to person, place, and time.     Sensory: No sensory deficit.     Motor: No weakness or abnormal muscle tone.     Coordination: Coordination normal.  Psychiatric:  Mood and Affect: Mood normal.        Behavior: Behavior normal.      Imaging: No results found.

## 2019-12-13 NOTE — Procedures (Signed)
Diagnostic Cervical Facet Joint Nerve Block with Fluoroscopic Guidance  Patient: Allen Taylor      Date of Birth: 04/18/1946 MRN: [id number] PCP: Leanna Battles, MD      Visit Date: 12/09/2019   Universal Protocol:    Date/Time: 06/14/216:14 AM  Consent Given By: the patient  Position: PRONE  Additional Comments: Vital signs were monitored before and after the procedure. Patient was prepped and draped in the usual sterile fashion. The correct patient, procedure, and site was verified.   Injection Procedure Details:  Procedure Site One Meds Administered:  Meds ordered this encounter  Medications  . methylPREDNISolone acetate (DEPO-MEDROL) injection 40 mg     Laterality: Bilateral  Location/Site:  C2-3  Needle size: 25 G  Needle type: Spinal  Needle Placement: Articular Pillar  Findings:  -Contrast Used: 0.5 mL iohexol 180 mg iodine/mL   -Comments: Excellent flow of contrast across the articular pillars without intravascular flow  Procedure Details: The fluoroscope beam was positioned to square off the endplates of the desired vertebral level to achieve a true AP position. The beam was then moved in a small "counter" oblique to the contralateral side with a small amount of caudal tilt to achieve a trajectory alignment with the desired nerves.  For each target described below the skin was anesthetized with 1 ml of 1% Lidocaine without epinephrine.   To block the facet joint nerve to C2, the needle was fluoroscopically positioned over the inferior lateral portion of the C2/3 facet joint nerve where the third occipital nerve (TON) lies.  After contact with periosteum and negative aspirate for blood and CSF, correct placement without intravascular or epidural spread was confirmed by Bi-planar images and  injecting 0.5 ml. of Omnipaque-240.  A spot radiograph was obtained of this image.  Next, a 0.5 ml. volume of 1% Lidocaine without Epinephrine was then  injected.  Prior to the procedure, the patient was given a Pain Diary which was completed for baseline measurements.  After the procedure, the patient rated their pain every 30 minutes and will continue rating at this frequency for a total of 5 hours.  The patient has been asked to complete the Diary and return to [place] by mail, fax or hand delivered as soon as possible.   Additional Comments:  The patient tolerated the procedure well Dressing: Band-Aid    Post-procedure details: Patient was observed during the procedure. Post-procedure instructions were reviewed.  Patient left the clinic in stable condition.

## 2019-12-14 ENCOUNTER — Ambulatory Visit: Payer: Medicare Other | Admitting: Physical Medicine and Rehabilitation

## 2020-03-02 DIAGNOSIS — D649 Anemia, unspecified: Secondary | ICD-10-CM | POA: Diagnosis not present

## 2020-03-02 DIAGNOSIS — K529 Noninfective gastroenteritis and colitis, unspecified: Secondary | ICD-10-CM | POA: Diagnosis not present

## 2020-03-02 DIAGNOSIS — R739 Hyperglycemia, unspecified: Secondary | ICD-10-CM | POA: Diagnosis not present

## 2020-03-02 DIAGNOSIS — E785 Hyperlipidemia, unspecified: Secondary | ICD-10-CM | POA: Diagnosis not present

## 2020-03-02 DIAGNOSIS — Z125 Encounter for screening for malignant neoplasm of prostate: Secondary | ICD-10-CM | POA: Diagnosis not present

## 2020-03-02 DIAGNOSIS — D509 Iron deficiency anemia, unspecified: Secondary | ICD-10-CM | POA: Diagnosis not present

## 2020-03-09 DIAGNOSIS — F419 Anxiety disorder, unspecified: Secondary | ICD-10-CM | POA: Diagnosis not present

## 2020-03-09 DIAGNOSIS — R82998 Other abnormal findings in urine: Secondary | ICD-10-CM | POA: Diagnosis not present

## 2020-03-09 DIAGNOSIS — M25572 Pain in left ankle and joints of left foot: Secondary | ICD-10-CM | POA: Diagnosis not present

## 2020-03-09 DIAGNOSIS — M48061 Spinal stenosis, lumbar region without neurogenic claudication: Secondary | ICD-10-CM | POA: Diagnosis not present

## 2020-03-09 DIAGNOSIS — R739 Hyperglycemia, unspecified: Secondary | ICD-10-CM | POA: Diagnosis not present

## 2020-03-09 DIAGNOSIS — N529 Male erectile dysfunction, unspecified: Secondary | ICD-10-CM | POA: Diagnosis not present

## 2020-03-09 DIAGNOSIS — E785 Hyperlipidemia, unspecified: Secondary | ICD-10-CM | POA: Diagnosis not present

## 2020-03-09 DIAGNOSIS — Z0000 Encounter for general adult medical examination without abnormal findings: Secondary | ICD-10-CM | POA: Diagnosis not present

## 2020-03-09 DIAGNOSIS — E669 Obesity, unspecified: Secondary | ICD-10-CM | POA: Diagnosis not present

## 2020-03-09 DIAGNOSIS — R030 Elevated blood-pressure reading, without diagnosis of hypertension: Secondary | ICD-10-CM | POA: Diagnosis not present

## 2020-03-09 DIAGNOSIS — M545 Low back pain: Secondary | ICD-10-CM | POA: Diagnosis not present

## 2020-03-09 DIAGNOSIS — K921 Melena: Secondary | ICD-10-CM | POA: Diagnosis not present

## 2020-03-20 ENCOUNTER — Encounter (HOSPITAL_BASED_OUTPATIENT_CLINIC_OR_DEPARTMENT_OTHER): Payer: Self-pay | Admitting: *Deleted

## 2020-03-20 ENCOUNTER — Emergency Department (HOSPITAL_BASED_OUTPATIENT_CLINIC_OR_DEPARTMENT_OTHER)
Admission: EM | Admit: 2020-03-20 | Discharge: 2020-03-20 | Disposition: A | Discharge status: Home / Self Care | Payer: Medicare Other | Attending: Emergency Medicine | Admitting: Emergency Medicine

## 2020-03-20 ENCOUNTER — Emergency Department (HOSPITAL_BASED_OUTPATIENT_CLINIC_OR_DEPARTMENT_OTHER): Payer: Medicare Other

## 2020-03-20 ENCOUNTER — Other Ambulatory Visit: Payer: Self-pay

## 2020-03-20 DIAGNOSIS — S2242XA Multiple fractures of ribs, left side, initial encounter for closed fracture: Secondary | ICD-10-CM | POA: Diagnosis not present

## 2020-03-20 DIAGNOSIS — Z7982 Long term (current) use of aspirin: Secondary | ICD-10-CM | POA: Diagnosis not present

## 2020-03-20 DIAGNOSIS — W01198A Fall on same level from slipping, tripping and stumbling with subsequent striking against other object, initial encounter: Secondary | ICD-10-CM | POA: Diagnosis not present

## 2020-03-20 DIAGNOSIS — J90 Pleural effusion, not elsewhere classified: Secondary | ICD-10-CM | POA: Diagnosis not present

## 2020-03-20 DIAGNOSIS — R0602 Shortness of breath: Secondary | ICD-10-CM | POA: Insufficient documentation

## 2020-03-20 DIAGNOSIS — S299XXA Unspecified injury of thorax, initial encounter: Secondary | ICD-10-CM | POA: Diagnosis present

## 2020-03-20 DIAGNOSIS — Z87891 Personal history of nicotine dependence: Secondary | ICD-10-CM | POA: Insufficient documentation

## 2020-03-20 DIAGNOSIS — Z96642 Presence of left artificial hip joint: Secondary | ICD-10-CM | POA: Insufficient documentation

## 2020-03-20 DIAGNOSIS — J9811 Atelectasis: Secondary | ICD-10-CM | POA: Diagnosis not present

## 2020-03-20 DIAGNOSIS — K449 Diaphragmatic hernia without obstruction or gangrene: Secondary | ICD-10-CM | POA: Diagnosis not present

## 2020-03-20 DIAGNOSIS — W19XXXA Unspecified fall, initial encounter: Secondary | ICD-10-CM

## 2020-03-20 DIAGNOSIS — I712 Thoracic aortic aneurysm, without rupture: Secondary | ICD-10-CM | POA: Diagnosis not present

## 2020-03-20 LAB — CBC WITH DIFFERENTIAL/PLATELET
Abs Immature Granulocytes: 0.05 10*3/uL (ref 0.00–0.07)
Basophils Absolute: 0.1 10*3/uL (ref 0.0–0.1)
Basophils Relative: 1 %
Eosinophils Absolute: 0.2 10*3/uL (ref 0.0–0.5)
Eosinophils Relative: 1 %
HCT: 49.4 % (ref 39.0–52.0)
Hemoglobin: 15.6 g/dL (ref 13.0–17.0)
Immature Granulocytes: 0 %
Lymphocytes Relative: 10 %
Lymphs Abs: 1.5 10*3/uL (ref 0.7–4.0)
MCH: 28.5 pg (ref 26.0–34.0)
MCHC: 31.6 g/dL (ref 30.0–36.0)
MCV: 90.1 fL (ref 80.0–100.0)
Monocytes Absolute: 1.1 10*3/uL — ABNORMAL HIGH (ref 0.1–1.0)
Monocytes Relative: 8 %
Neutro Abs: 11.4 10*3/uL — ABNORMAL HIGH (ref 1.7–7.7)
Neutrophils Relative %: 80 %
Platelets: 350 10*3/uL (ref 150–400)
RBC: 5.48 MIL/uL (ref 4.22–5.81)
RDW: 13.6 % (ref 11.5–15.5)
WBC: 14.3 10*3/uL — ABNORMAL HIGH (ref 4.0–10.5)
nRBC: 0 % (ref 0.0–0.2)

## 2020-03-20 LAB — BASIC METABOLIC PANEL
Anion gap: 11 (ref 5–15)
BUN: 16 mg/dL (ref 8–23)
CO2: 28 mmol/L (ref 22–32)
Calcium: 9.2 mg/dL (ref 8.9–10.3)
Chloride: 99 mmol/L (ref 98–111)
Creatinine, Ser: 0.72 mg/dL (ref 0.61–1.24)
GFR calc Af Amer: >60 mL/min (ref >60)
GFR calc non Af Amer: >60 mL/min (ref >60)
Glucose, Bld: 119 mg/dL — ABNORMAL HIGH (ref 70–99)
Potassium: 3.7 mmol/L (ref 3.5–5.1)
Sodium: 138 mmol/L (ref 135–145)

## 2020-03-20 MED ORDER — IOHEXOL 300 MG/ML  SOLN
100.0000 mL | Freq: Once | INTRAMUSCULAR | Status: AC | PRN
  Administered 2020-03-20: 100 mL via INTRAVENOUS

## 2020-03-20 MED ORDER — OXYCODONE HCL 5 MG PO TABS: 5.0000 mg | ORAL_TABLET | Freq: Four times a day (QID) | ORAL | 0 refills | Status: DC | PRN

## 2020-03-20 MED ORDER — LIDOCAINE 5 % EX PTCH: 1.0000 | MEDICATED_PATCH | CUTANEOUS | 0 refills | Status: DC

## 2020-03-20 MED ORDER — FENTANYL CITRATE (PF) 100 MCG/2ML IJ SOLN
50.0000 ug | Freq: Once | INTRAMUSCULAR | Status: AC
  Administered 2020-03-20: 50 ug via INTRAVENOUS
  Filled 2020-03-20: qty 2

## 2020-03-20 NOTE — ED Notes (Signed)
Pt states fell Saturday am at 0-300  Has had left rib pain since
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
## 2020-03-20 NOTE — ED Triage Notes (Signed)
Pulse ox 94% on RA.  Pt states he feels sob.  HR 98

## 2020-03-20 NOTE — ED Triage Notes (Signed)
He lost his balance and fell at 3am. Lower left rib injury. SOB. Feels like something is jabbing his lung. SOB.

## 2020-03-20 NOTE — Discharge Instructions (Signed)
Please take your prescribed hydrocodone as directed. IF you need additional pain relief, you can take the oxycodone. It is important that you remember these medications can combine to cause increased drowsiness and increased risk of falling. Take only as needed and with caution. Use the incentive spirometer 4-5 times daily. You can continue applying ice to areas of pain for 20 minutes at a time. Please follow closely with your primary care provided regarding your visit today. Return to the ED if you experience worsening shortness of breath or are unable to manage your pain at home.

## 2020-03-27 DIAGNOSIS — S2242XA Multiple fractures of ribs, left side, initial encounter for closed fracture: Secondary | ICD-10-CM | POA: Diagnosis not present

## 2020-03-27 DIAGNOSIS — W19XXXA Unspecified fall, initial encounter: Secondary | ICD-10-CM | POA: Diagnosis not present

## 2020-04-12 DIAGNOSIS — H31009 Unspecified chorioretinal scars, unspecified eye: Secondary | ICD-10-CM | POA: Insufficient documentation

## 2020-04-12 DIAGNOSIS — H35412 Lattice degeneration of retina, left eye: Secondary | ICD-10-CM | POA: Insufficient documentation

## 2020-04-12 DIAGNOSIS — H43811 Vitreous degeneration, right eye: Secondary | ICD-10-CM | POA: Insufficient documentation

## 2020-04-12 DIAGNOSIS — H33322 Round hole, left eye: Secondary | ICD-10-CM | POA: Insufficient documentation

## 2020-04-12 DIAGNOSIS — H43392 Other vitreous opacities, left eye: Secondary | ICD-10-CM | POA: Insufficient documentation

## 2020-04-12 DIAGNOSIS — Z961 Presence of intraocular lens: Secondary | ICD-10-CM | POA: Insufficient documentation

## 2020-04-12 DIAGNOSIS — H33302 Unspecified retinal break, left eye: Secondary | ICD-10-CM | POA: Insufficient documentation

## 2020-04-15 ENCOUNTER — Ambulatory Visit: Payer: Medicare Other | Attending: Internal Medicine

## 2020-04-15 DIAGNOSIS — Z23 Encounter for immunization: Secondary | ICD-10-CM

## 2020-04-15 NOTE — Progress Notes (Signed)
° °  Covid-19 Vaccination Clinic  Name:  Allen Taylor    MRN: [id number] DOB: January 10, 1946  04/15/2020  [name] was observed post Covid-19 immunization for 15 minutes without incident. He was provided with Vaccine Information Sheet and instruction to access the V-Safe system.   [name] was instructed to call 911 with any severe reactions post vaccine:  Difficulty breathing   Swelling of face and throat   A fast heartbeat   A bad rash all over body   Dizziness and weakness

## 2020-04-20 ENCOUNTER — Other Ambulatory Visit: Payer: Self-pay

## 2020-04-20 ENCOUNTER — Encounter (INDEPENDENT_AMBULATORY_CARE_PROVIDER_SITE_OTHER): Payer: Self-pay | Admitting: Ophthalmology

## 2020-04-20 ENCOUNTER — Ambulatory Visit (INDEPENDENT_AMBULATORY_CARE_PROVIDER_SITE_OTHER): Payer: Medicare Other | Admitting: Ophthalmology

## 2020-04-20 DIAGNOSIS — H33302 Unspecified retinal break, left eye: Secondary | ICD-10-CM | POA: Diagnosis not present

## 2020-04-20 DIAGNOSIS — H35412 Lattice degeneration of retina, left eye: Secondary | ICD-10-CM

## 2020-04-20 DIAGNOSIS — H43392 Other vitreous opacities, left eye: Secondary | ICD-10-CM

## 2020-04-20 DIAGNOSIS — Z961 Presence of intraocular lens: Secondary | ICD-10-CM | POA: Diagnosis not present

## 2020-04-20 DIAGNOSIS — H31009 Unspecified chorioretinal scars, unspecified eye: Secondary | ICD-10-CM

## 2020-04-20 DIAGNOSIS — H43812 Vitreous degeneration, left eye: Secondary | ICD-10-CM

## 2020-04-20 DIAGNOSIS — H43811 Vitreous degeneration, right eye: Secondary | ICD-10-CM

## 2020-04-20 DIAGNOSIS — H33322 Round hole, left eye: Secondary | ICD-10-CM

## 2020-04-20 NOTE — Assessment & Plan Note (Signed)
No new breaks superotemporal quadrant left eye, good retinopexy, with 4 operculum.

## 2020-04-20 NOTE — Assessment & Plan Note (Signed)
No new breaks, good retinopexy superotemporal 10 meridian
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
## 2020-04-20 NOTE — Patient Instructions (Signed)
Vitreous Detachment  Vitreous detachment is part of the normal aging process in the eyes. Vitreous is the jelly-like substance that makes up most of the inside of the eyeballs. It helps the eyeballs keep a round shape. The vitreous is attached to the retina of the eye with a series of fibers. As you age, the vitreous gradually shrinks. Tension increases between the fibers and the retina. Eventually, the fibers can break free from the retina, causing vitreous detachment. In most cases, this does not cause problems and does not require treatment. However, it can sometimes cause the retina to separate from the eyeball (retinal detachment), which requires treatment to prevent vision loss. What are the causes? Aging is the main cause of vitreous detachment. Everyone's vitreous naturally shrinks with age. What increases the risk? You are more likely to have vitreous detachment if you:  Are at least 76 years old.  Have inflammation of the eye.  Have an eye injury.  Have had eye surgery.  Have a hemorrhage in your eye.  Are very nearsighted (myopia).  Have diabetes. What are the signs or symptoms? Most people with this condition will not notice any symptoms. If symptoms do occur, the most common are floaters. Floaters occur as the vitreous begins to shrink. They may:  Appear as tiny dots or webs in your vision.  Seem to disappear when you look at them directly.  Appear more often as your condition gets worse. Other symptoms include:  Flashes of light (photopsia) in your peripheral vision that may look like lightning streaks.  Decreased vision or a dark curtain or shadow moving across your field of vision. This is rare. How is this diagnosed? This condition may be diagnosed based on:  Your signs and symptoms.  An exam by a health care provider who specializes in conditions and diseases of the eye (ophthalmologist). The exam may include: ? Putting eye drops in your eye to make the  pupil wider (dilated). The pupil is the opening in the center of the eye. ? Checking the pupils with a magnifying glass. This exam is the best way to determine the type and extent of damage to your eye. How is this treated? For most people, a vitreous detachment is harmless, causing no symptoms or vision loss, and does not require treatment. Floaters usually become less noticeable over time. If the condition causes retinal detachment, you may need eye surgery to reattach your retina (reattachment surgery) in order to prevent vision loss or restore your vision. Follow these instructions at home:  Keep all follow-up visits as told by your health care provider. This is important. Get help right away if:  You develop signs of retinal detachment. These include: ? A sudden increase in the number of floaters you see. ? An increase in the number of flashes of light you see in your peripheral vision. ? Decreased vision. Summary  Vitreous detachment is part of the normal aging process in the eyes.  Vitreous is the jelly-like substance inside the eyeballs. As you age, the vitreous shrinks, and the fibers that attach the vitreous to the retina can break free, causing vitreous detachment.  In most cases, vitreous detachment does not cause symptoms and does not require treatment. The most common symptom that can occur is seeing floaters that appear as tiny dots or webs in your vision.  Vitreous detachment can sometimes cause the retina to separate from the eyeball (retinal detachment). This must be treated to prevent vision loss. This information is not  intended to replace advice given to you by your health care provider. Make sure you discuss any questions you have with your health care provider. Document Revised: 05/30/2017 Document Reviewed: 05/08/2017 Elsevier Patient Education  2020 [employer].

## 2020-05-09 DIAGNOSIS — Z23 Encounter for immunization: Secondary | ICD-10-CM | POA: Diagnosis not present

## 2020-05-12 IMAGING — DX DG PORTABLE PELVIS
1 series · 1 of 1 positions shown · non-contrast
Comparison: None.

CLINICAL DATA: Status post left hip replacement

EXAM:
PORTABLE PELVIS 1-2 VIEWS

[pelvis ap]
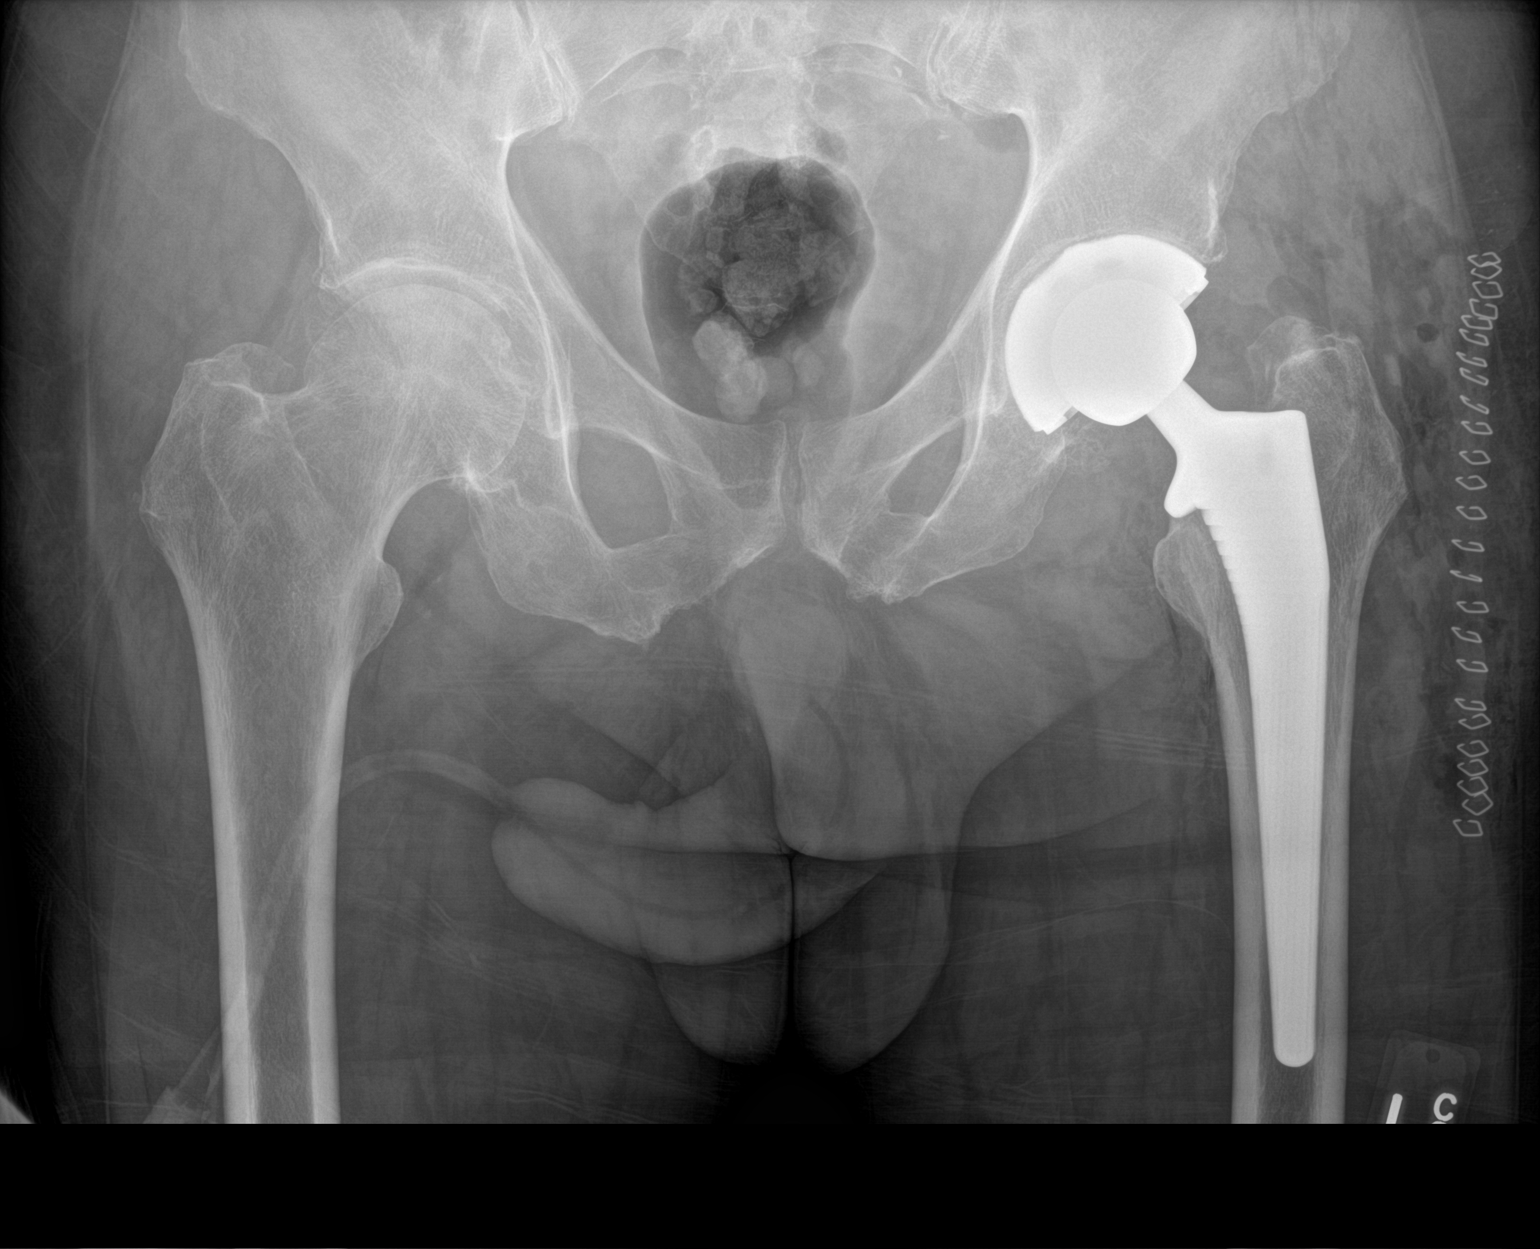

[1 of 1 positions shown; findings below may reference images not displayed]

FINDINGS: Left hip prosthesis is noted in satisfactory position. No acute bony
or soft tissue abnormality is noted.
IMPRESSION: Status post left hip replacement.

## 2020-05-12 IMAGING — RF DG HIP (WITH PELVIS) OPERATIVE*L*
1 series · 2 of 2 positions shown · non-contrast
Comparison: None.

CLINICAL DATA: Osteoarthritis of the left hip. Left hip
arthroplasty.

EXAM:
OPERATIVE LEFT HIP (WITH PELVIS IF PERFORMED) 1 VIEW
TECHNIQUE: Fluoroscopic spot image(s) were submitted for interpretation
post-operatively.

[Series 1: unknown protocol · 0.20mm/px · 2 of 2 slices shown]
[im 1/2]
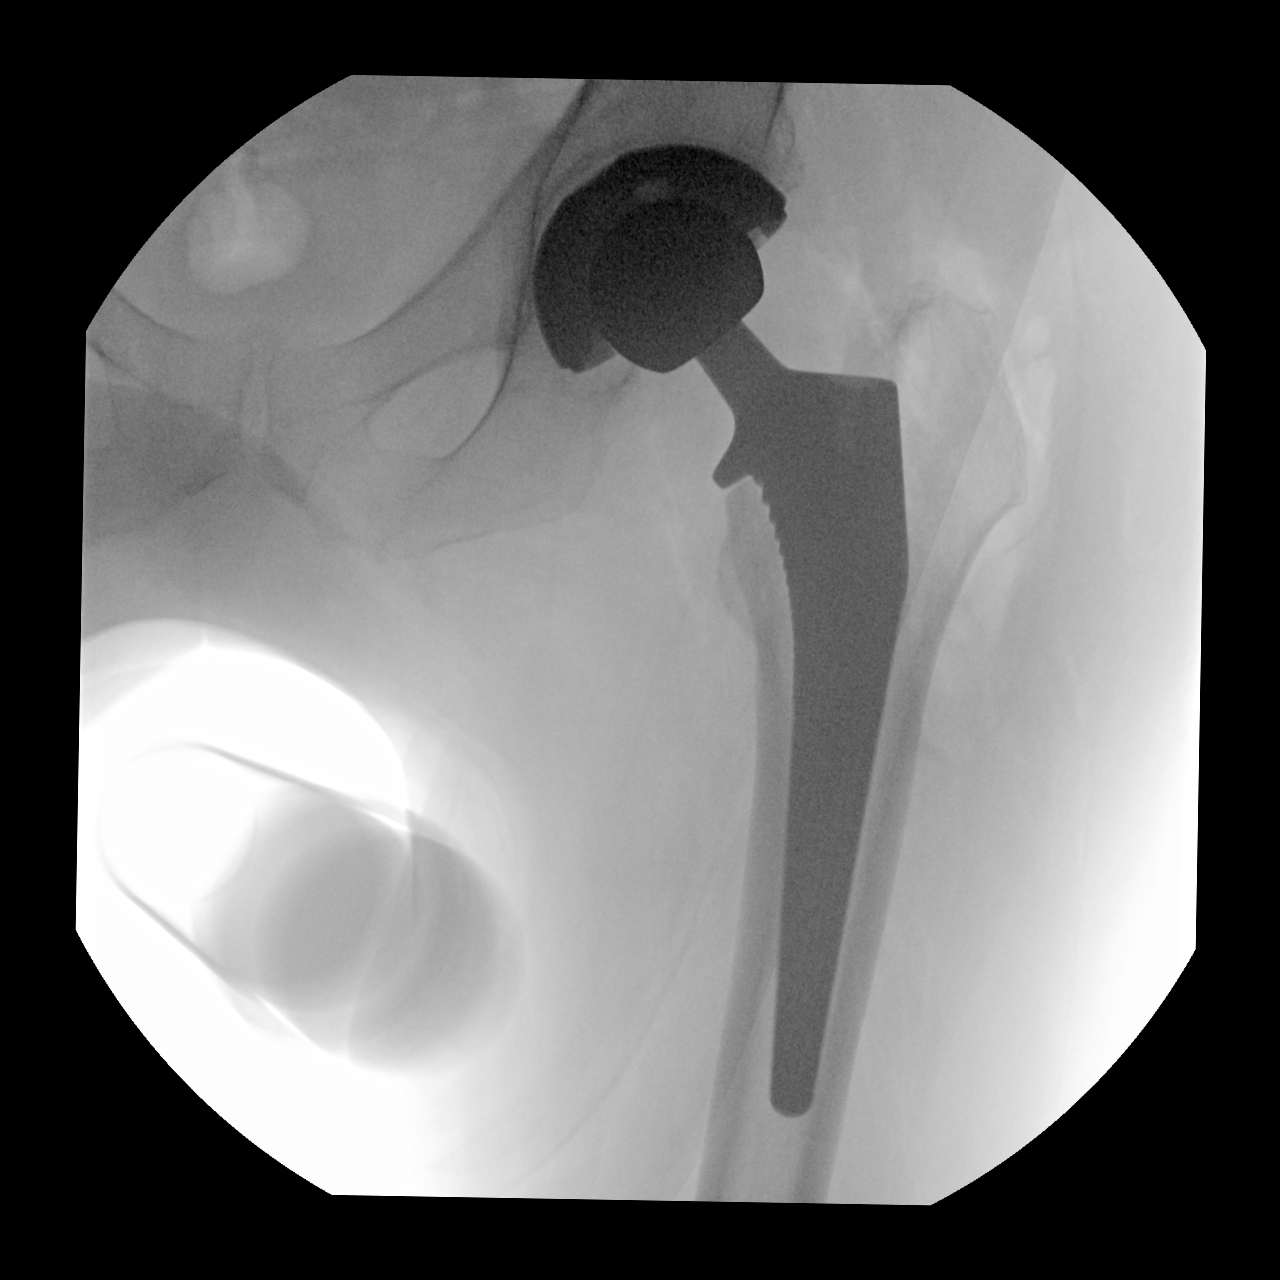
[im 2/2]
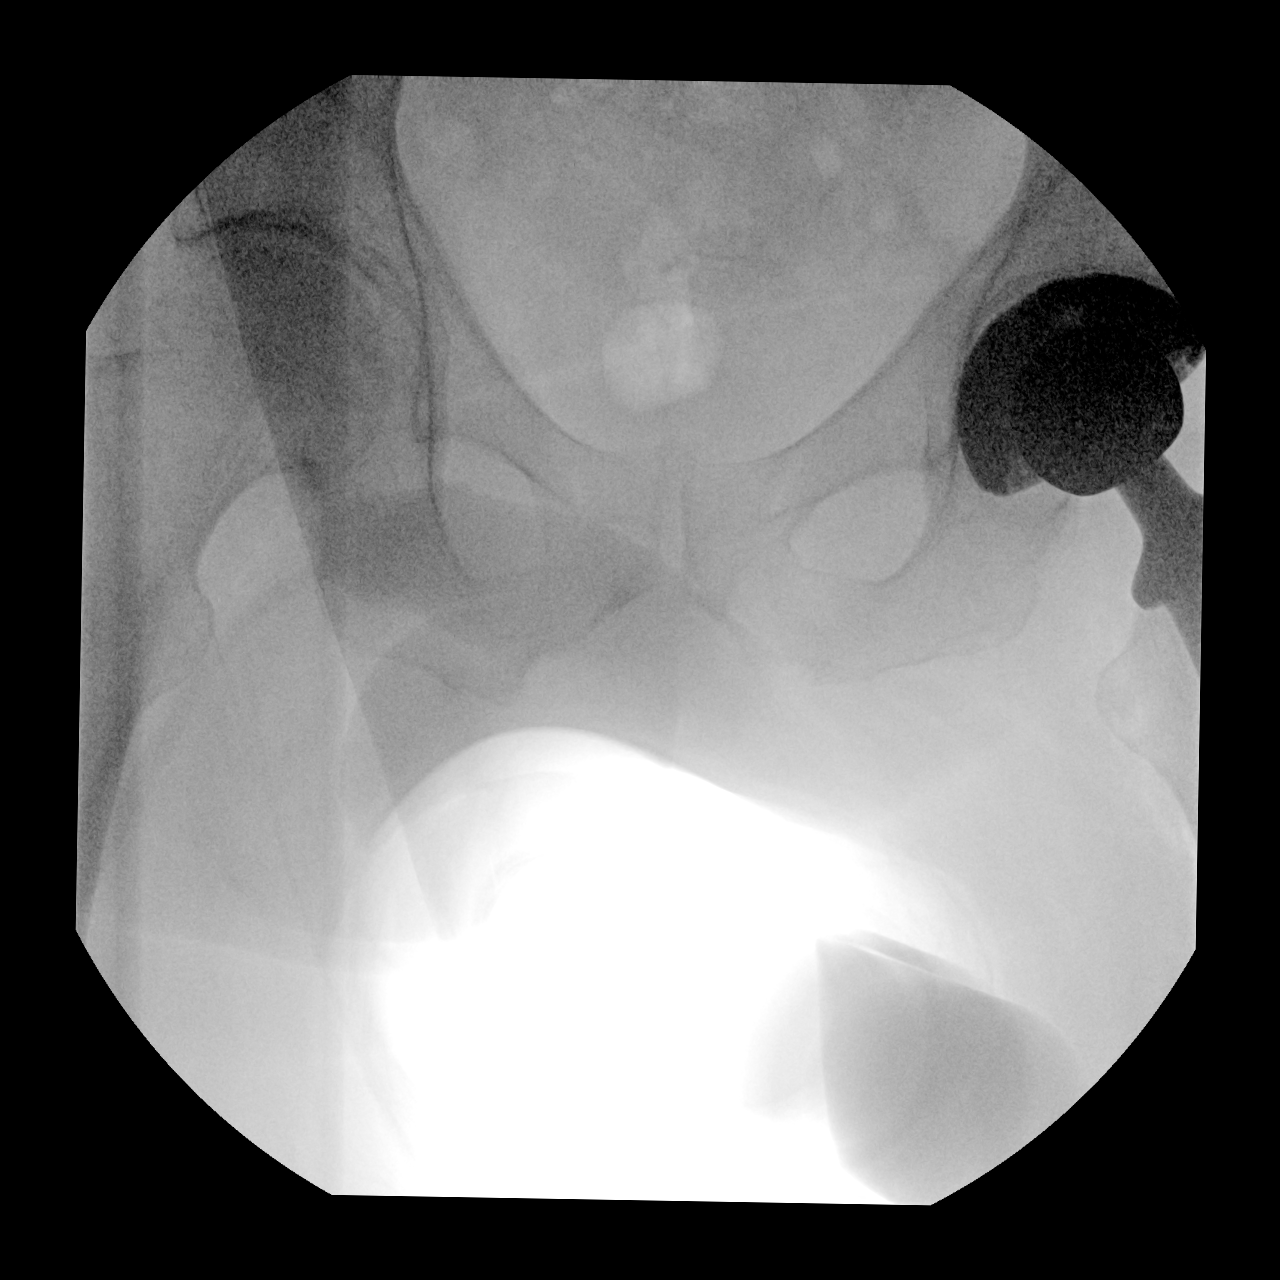

[2 of 2 positions shown; findings below may reference images not displayed]

FINDINGS: AP C-arm images of the left hip demonstrate that the acetabular and
femoral components of the hip prosthesis appear in excellent
position in the AP projection. No fractures.
IMPRESSION: Satisfactory appearance of the left hip in the AP projection after
total hip prosthesis insertion.

## 2020-06-19 DIAGNOSIS — J343 Hypertrophy of nasal turbinates: Secondary | ICD-10-CM | POA: Diagnosis not present

## 2020-06-19 DIAGNOSIS — H903 Sensorineural hearing loss, bilateral: Secondary | ICD-10-CM | POA: Insufficient documentation

## 2020-06-19 DIAGNOSIS — Z974 Presence of external hearing-aid: Secondary | ICD-10-CM | POA: Diagnosis not present

## 2020-06-19 DIAGNOSIS — H6123 Impacted cerumen, bilateral: Secondary | ICD-10-CM | POA: Insufficient documentation

## 2020-07-24 DIAGNOSIS — D509 Iron deficiency anemia, unspecified: Secondary | ICD-10-CM | POA: Diagnosis not present

## 2020-07-24 DIAGNOSIS — S2242XD Multiple fractures of ribs, left side, subsequent encounter for fracture with routine healing: Secondary | ICD-10-CM | POA: Diagnosis not present

## 2020-07-24 DIAGNOSIS — M25572 Pain in left ankle and joints of left foot: Secondary | ICD-10-CM | POA: Diagnosis not present

## 2020-07-24 DIAGNOSIS — L0889 Other specified local infections of the skin and subcutaneous tissue: Secondary | ICD-10-CM | POA: Diagnosis not present

## 2020-08-07 DIAGNOSIS — R0981 Nasal congestion: Secondary | ICD-10-CM | POA: Diagnosis not present

## 2020-08-07 DIAGNOSIS — R059 Cough, unspecified: Secondary | ICD-10-CM | POA: Diagnosis not present

## 2020-08-07 DIAGNOSIS — R519 Headache, unspecified: Secondary | ICD-10-CM | POA: Diagnosis not present

## 2020-08-07 DIAGNOSIS — Z20828 Contact with and (suspected) exposure to other viral communicable diseases: Secondary | ICD-10-CM | POA: Diagnosis not present

## 2020-08-07 DIAGNOSIS — Z1152 Encounter for screening for COVID-19: Secondary | ICD-10-CM | POA: Diagnosis not present

## 2020-08-16 DIAGNOSIS — H1031 Unspecified acute conjunctivitis, right eye: Secondary | ICD-10-CM | POA: Diagnosis not present

## 2020-10-11 DIAGNOSIS — H0102B Squamous blepharitis left eye, upper and lower eyelids: Secondary | ICD-10-CM | POA: Diagnosis not present

## 2020-10-11 DIAGNOSIS — H0102A Squamous blepharitis right eye, upper and lower eyelids: Secondary | ICD-10-CM | POA: Diagnosis not present

## 2020-10-11 DIAGNOSIS — Z961 Presence of intraocular lens: Secondary | ICD-10-CM | POA: Diagnosis not present

## 2020-10-11 DIAGNOSIS — H26493 Other secondary cataract, bilateral: Secondary | ICD-10-CM | POA: Diagnosis not present

## 2020-10-11 DIAGNOSIS — H31092 Other chorioretinal scars, left eye: Secondary | ICD-10-CM | POA: Diagnosis not present

## 2021-03-23 DIAGNOSIS — R739 Hyperglycemia, unspecified: Secondary | ICD-10-CM | POA: Diagnosis not present

## 2021-03-23 DIAGNOSIS — Z125 Encounter for screening for malignant neoplasm of prostate: Secondary | ICD-10-CM | POA: Diagnosis not present

## 2021-03-23 DIAGNOSIS — E785 Hyperlipidemia, unspecified: Secondary | ICD-10-CM | POA: Diagnosis not present

## 2021-04-03 DIAGNOSIS — R6882 Decreased libido: Secondary | ICD-10-CM | POA: Diagnosis not present

## 2021-04-03 DIAGNOSIS — R739 Hyperglycemia, unspecified: Secondary | ICD-10-CM | POA: Diagnosis not present

## 2021-04-03 DIAGNOSIS — M79642 Pain in left hand: Secondary | ICD-10-CM | POA: Diagnosis not present

## 2021-04-03 DIAGNOSIS — Z23 Encounter for immunization: Secondary | ICD-10-CM | POA: Diagnosis not present

## 2021-04-03 DIAGNOSIS — I7121 Aneurysm of the ascending aorta, without rupture: Secondary | ICD-10-CM | POA: Diagnosis not present

## 2021-04-03 DIAGNOSIS — R82998 Other abnormal findings in urine: Secondary | ICD-10-CM | POA: Diagnosis not present

## 2021-04-03 DIAGNOSIS — R911 Solitary pulmonary nodule: Secondary | ICD-10-CM | POA: Diagnosis not present

## 2021-04-03 DIAGNOSIS — Z0000 Encounter for general adult medical examination without abnormal findings: Secondary | ICD-10-CM | POA: Diagnosis not present

## 2021-04-03 DIAGNOSIS — Z1212 Encounter for screening for malignant neoplasm of rectum: Secondary | ICD-10-CM | POA: Diagnosis not present

## 2021-04-03 DIAGNOSIS — M25572 Pain in left ankle and joints of left foot: Secondary | ICD-10-CM | POA: Diagnosis not present

## 2021-04-03 DIAGNOSIS — E785 Hyperlipidemia, unspecified: Secondary | ICD-10-CM | POA: Diagnosis not present

## 2021-04-17 ENCOUNTER — Other Ambulatory Visit (HOSPITAL_BASED_OUTPATIENT_CLINIC_OR_DEPARTMENT_OTHER): Payer: Self-pay

## 2021-04-17 ENCOUNTER — Ambulatory Visit: Payer: Medicare Other | Attending: Internal Medicine

## 2021-04-17 DIAGNOSIS — Z23 Encounter for immunization: Secondary | ICD-10-CM

## 2021-04-17 MED ORDER — PFIZER COVID-19 VAC BIVALENT 30 MCG/0.3ML IM SUSP
INTRAMUSCULAR | 0 refills | Status: DC
  Filled 2021-04-17: qty 0.3, 1d supply, fill #0

## 2021-04-17 NOTE — Progress Notes (Signed)
   Covid-19 Vaccination Clinic  Name:  Allen Taylor    MRN: [id number] DOB: [birth year]  04/17/2021  [name] was observed post Covid-19 immunization for 15 minutes without incident. He was provided with Vaccine Information Sheet and instruction to access the V-Safe system.   [name] was instructed to call 911 with any severe reactions post vaccine: Difficulty breathing  Swelling of face and throat  A fast heartbeat  A bad rash all over body  Dizziness and weakness

## 2021-04-23 ENCOUNTER — Ambulatory Visit (INDEPENDENT_AMBULATORY_CARE_PROVIDER_SITE_OTHER): Payer: Medicare Other | Admitting: Ophthalmology

## 2021-04-23 ENCOUNTER — Other Ambulatory Visit: Payer: Self-pay

## 2021-04-23 ENCOUNTER — Encounter (INDEPENDENT_AMBULATORY_CARE_PROVIDER_SITE_OTHER): Payer: Medicare Other | Admitting: Ophthalmology

## 2021-04-23 ENCOUNTER — Encounter (INDEPENDENT_AMBULATORY_CARE_PROVIDER_SITE_OTHER): Payer: Self-pay | Admitting: Ophthalmology

## 2021-04-23 DIAGNOSIS — H33321 Round hole, right eye: Secondary | ICD-10-CM | POA: Diagnosis not present

## 2021-04-23 DIAGNOSIS — H43811 Vitreous degeneration, right eye: Secondary | ICD-10-CM | POA: Diagnosis not present

## 2021-04-23 DIAGNOSIS — H33302 Unspecified retinal break, left eye: Secondary | ICD-10-CM

## 2021-04-23 DIAGNOSIS — Z961 Presence of intraocular lens: Secondary | ICD-10-CM | POA: Diagnosis not present

## 2021-04-23 NOTE — Assessment & Plan Note (Signed)
Good retinopexy superotemporal OS no new breaks

## 2021-04-23 NOTE — Assessment & Plan Note (Signed)
No new retinal breaks OD

## 2021-04-23 NOTE — Assessment & Plan Note (Signed)
Stable OU looks great
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
## 2021-05-14 ENCOUNTER — Other Ambulatory Visit: Payer: Self-pay

## 2021-05-14 DIAGNOSIS — D485 Neoplasm of uncertain behavior of skin: Secondary | ICD-10-CM | POA: Insufficient documentation

## 2021-05-14 DIAGNOSIS — M159 Polyosteoarthritis, unspecified: Secondary | ICD-10-CM | POA: Insufficient documentation

## 2021-05-14 DIAGNOSIS — L570 Actinic keratosis: Secondary | ICD-10-CM | POA: Insufficient documentation

## 2021-05-14 DIAGNOSIS — R69 Illness, unspecified: Secondary | ICD-10-CM | POA: Insufficient documentation

## 2021-05-14 DIAGNOSIS — S88119A Complete traumatic amputation at level between knee and ankle, unspecified lower leg, initial encounter: Secondary | ICD-10-CM | POA: Insufficient documentation

## 2021-05-14 DIAGNOSIS — Z860100 Personal history of colon polyps, unspecified: Secondary | ICD-10-CM | POA: Insufficient documentation

## 2021-05-14 DIAGNOSIS — M545 Low back pain, unspecified: Secondary | ICD-10-CM | POA: Insufficient documentation

## 2021-05-14 DIAGNOSIS — Z461 Encounter for fitting and adjustment of hearing aid: Secondary | ICD-10-CM | POA: Insufficient documentation

## 2021-05-14 DIAGNOSIS — G8929 Other chronic pain: Secondary | ICD-10-CM | POA: Insufficient documentation

## 2021-05-14 DIAGNOSIS — M86669 Other chronic osteomyelitis, unspecified tibia and fibula: Secondary | ICD-10-CM | POA: Insufficient documentation

## 2021-05-14 DIAGNOSIS — Z8711 Personal history of peptic ulcer disease: Secondary | ICD-10-CM | POA: Insufficient documentation

## 2021-05-14 DIAGNOSIS — K529 Noninfective gastroenteritis and colitis, unspecified: Secondary | ICD-10-CM | POA: Insufficient documentation

## 2021-05-14 DIAGNOSIS — M869 Osteomyelitis, unspecified: Secondary | ICD-10-CM | POA: Insufficient documentation

## 2021-05-14 DIAGNOSIS — Z800 Family history of malignant neoplasm of digestive organs: Secondary | ICD-10-CM | POA: Insufficient documentation

## 2021-05-14 DIAGNOSIS — K513 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: Secondary | ICD-10-CM | POA: Insufficient documentation

## 2021-05-14 DIAGNOSIS — S91009A Unspecified open wound, unspecified ankle, initial encounter: Secondary | ICD-10-CM | POA: Insufficient documentation

## 2021-05-14 DIAGNOSIS — Z7189 Other specified counseling: Secondary | ICD-10-CM | POA: Insufficient documentation

## 2021-05-17 ENCOUNTER — Ambulatory Visit (INDEPENDENT_AMBULATORY_CARE_PROVIDER_SITE_OTHER): Payer: Medicare Other | Admitting: Gastroenterology

## 2021-05-17 ENCOUNTER — Encounter: Payer: Self-pay | Admitting: Gastroenterology

## 2021-05-17 VITALS — BP 144/80 | HR 85 | Ht 66.0 in | Wt 193.0 lb

## 2021-05-17 DIAGNOSIS — R195 Other fecal abnormalities: Secondary | ICD-10-CM

## 2021-05-17 NOTE — Patient Instructions (Signed)
If you are age 77 or older, your body mass index should be between 23-30. Your Body mass index is 31.15 kg/m. If this is out of the aforementioned range listed, please consider follow up with your Primary Care Provider.  If you are age 43 or younger, your body mass index should be between 19-25. Your Body mass index is 31.15 kg/m. If this is out of the aformentioned range listed, please consider follow up with your Primary Care Provider.   ________________________________________________________  The [facility] providers would like to encourage you to use [facility] to communicate with providers for non-urgent requests or questions.  Due to long hold times on the telephone, sending your provider a message by [facility] may be a faster and more efficient way to get a response.  Please allow 48 business hours for a response.  Please remember that this is for non-urgent requests.  _______________________________________________________  It was a pleasure to see you today!  Thank you for trusting me with your gastrointestinal care!
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
## 2021-05-22 ENCOUNTER — Ambulatory Visit (INDEPENDENT_AMBULATORY_CARE_PROVIDER_SITE_OTHER): Payer: Medicare Other | Admitting: Orthopaedic Surgery

## 2021-05-22 ENCOUNTER — Other Ambulatory Visit: Payer: Self-pay

## 2021-05-22 DIAGNOSIS — G5602 Carpal tunnel syndrome, left upper limb: Secondary | ICD-10-CM

## 2021-05-22 DIAGNOSIS — M65352 Trigger finger, left little finger: Secondary | ICD-10-CM | POA: Diagnosis not present
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
## 2021-07-26 ENCOUNTER — Encounter: Payer: Self-pay | Admitting: Orthopaedic Surgery

## 2021-07-26 NOTE — Telephone Encounter (Signed)
Patient surgery is scheduled for  08-09-21.  At the request of the patient an email was sent to wife's email account on Friday, Jan 20th at 1:3pm.  When patients have surgery at the [facility], their surgery case will not show in Epic because the facility is private and not part of [facility]. Only patient's post operative appointment will be visible in Epic.  I called patient and he is aware.  His wife confirmed she has received the e-mail from me with all information and instructions for his surgery.

## 2021-07-26 NOTE — Telephone Encounter (Signed)
Thank you Debbie!

## 2021-08-02 ENCOUNTER — Other Ambulatory Visit: Payer: Self-pay | Admitting: Physician Assistant

## 2021-08-02 ENCOUNTER — Telehealth: Payer: Self-pay | Admitting: Physician Assistant

## 2021-08-02 NOTE — Telephone Encounter (Signed)
Can you call patient and see what pharmacy he would like me to send his meds to for surgery next week

## 2021-08-02 NOTE — Telephone Encounter (Signed)
Called patient no answer LMOM.  Just need to know what pharm he uses.

## 2021-08-03 ENCOUNTER — Telehealth: Payer: Self-pay | Admitting: Physician Assistant

## 2021-08-03 NOTE — Telephone Encounter (Signed)
Patient called advised [facility] uses [facility] on [address]  Ph# [phone]      The number to contact patient if needed is [phone]

## 2021-08-03 NOTE — Telephone Encounter (Signed)
Called patient no answer.

## 2021-08-07 ENCOUNTER — Other Ambulatory Visit: Payer: Self-pay | Admitting: Physician Assistant

## 2021-08-07 MED ORDER — DOCUSATE SODIUM 100 MG PO CAPS: 100.0000 mg | ORAL_CAPSULE | Freq: Every day | ORAL | 2 refills | Status: DC | PRN

## 2021-08-07 MED ORDER — ASPIRIN EC 81 MG PO TBEC: 81.0000 mg | DELAYED_RELEASE_TABLET | Freq: Two times a day (BID) | ORAL | 0 refills | Status: DC

## 2021-08-07 MED ORDER — METHOCARBAMOL 500 MG PO TABS: 500.0000 mg | ORAL_TABLET | Freq: Two times a day (BID) | ORAL | 0 refills | Status: DC | PRN

## 2021-08-07 MED ORDER — ONDANSETRON HCL 4 MG PO TABS: 4.0000 mg | ORAL_TABLET | Freq: Three times a day (TID) | ORAL | 0 refills | Status: DC | PRN

## 2021-08-07 MED ORDER — OXYCODONE-ACETAMINOPHEN 5-325 MG PO TABS: 1.0000 | ORAL_TABLET | Freq: Four times a day (QID) | ORAL | 0 refills | Status: DC | PRN

## 2021-08-07 NOTE — Telephone Encounter (Signed)
Uses [facility] [employer].

## 2021-08-07 NOTE — Telephone Encounter (Signed)
Sent in

## 2021-08-09 ENCOUNTER — Encounter: Payer: Self-pay | Admitting: Orthopaedic Surgery

## 2021-08-09 DIAGNOSIS — M65352 Trigger finger, left little finger: Secondary | ICD-10-CM | POA: Diagnosis not present

## 2021-08-09 DIAGNOSIS — G5602 Carpal tunnel syndrome, left upper limb: Secondary | ICD-10-CM | POA: Diagnosis not present

## 2021-08-16 ENCOUNTER — Ambulatory Visit (INDEPENDENT_AMBULATORY_CARE_PROVIDER_SITE_OTHER): Payer: Medicare Other | Admitting: Physician Assistant

## 2021-08-16 ENCOUNTER — Encounter: Payer: Self-pay | Admitting: Orthopaedic Surgery

## 2021-08-16 ENCOUNTER — Other Ambulatory Visit: Payer: Self-pay

## 2021-08-16 ENCOUNTER — Ambulatory Visit: Payer: Medicare Other | Admitting: Orthopaedic Surgery

## 2021-08-16 VITALS — Ht 66.0 in | Wt 193.0 lb

## 2021-08-16 DIAGNOSIS — M65352 Trigger finger, left little finger: Secondary | ICD-10-CM

## 2021-08-16 DIAGNOSIS — G5602 Carpal tunnel syndrome, left upper limb: Secondary | ICD-10-CM
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
## 2021-08-23 ENCOUNTER — Encounter: Payer: Self-pay | Admitting: Orthopaedic Surgery

## 2021-08-23 ENCOUNTER — Ambulatory Visit (INDEPENDENT_AMBULATORY_CARE_PROVIDER_SITE_OTHER): Payer: Medicare Other | Admitting: Orthopaedic Surgery

## 2021-08-23 ENCOUNTER — Other Ambulatory Visit: Payer: Self-pay

## 2021-08-23 DIAGNOSIS — M65352 Trigger finger, left little finger: Secondary | ICD-10-CM

## 2021-08-23 DIAGNOSIS — Z9889 Other specified postprocedural states: Secondary | ICD-10-CM

## 2021-08-23 DIAGNOSIS — G5602 Carpal tunnel syndrome, left upper limb: Secondary | ICD-10-CM
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
## 2021-09-18 ENCOUNTER — Ambulatory Visit (INDEPENDENT_AMBULATORY_CARE_PROVIDER_SITE_OTHER): Payer: Medicare Other | Admitting: Physician Assistant

## 2021-09-18 ENCOUNTER — Encounter: Payer: Self-pay | Admitting: Orthopaedic Surgery

## 2021-09-18 ENCOUNTER — Other Ambulatory Visit: Payer: Self-pay

## 2021-09-18 DIAGNOSIS — G5602 Carpal tunnel syndrome, left upper limb: Secondary | ICD-10-CM

## 2021-09-18 DIAGNOSIS — M65352 Trigger finger, left little finger: Secondary | ICD-10-CM
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
## 2021-10-01 DIAGNOSIS — M25572 Pain in left ankle and joints of left foot: Secondary | ICD-10-CM | POA: Diagnosis not present

## 2021-10-01 DIAGNOSIS — R030 Elevated blood-pressure reading, without diagnosis of hypertension: Secondary | ICD-10-CM | POA: Diagnosis not present

## 2021-10-01 DIAGNOSIS — R739 Hyperglycemia, unspecified: Secondary | ICD-10-CM | POA: Diagnosis not present

## 2021-10-01 DIAGNOSIS — M545 Low back pain, unspecified: Secondary | ICD-10-CM | POA: Diagnosis not present

## 2021-10-01 DIAGNOSIS — G8929 Other chronic pain: Secondary | ICD-10-CM | POA: Diagnosis not present

## 2021-10-01 DIAGNOSIS — I7121 Aneurysm of the ascending aorta, without rupture: Secondary | ICD-10-CM | POA: Diagnosis not present

## 2021-10-08 ENCOUNTER — Other Ambulatory Visit: Payer: Self-pay | Admitting: Internal Medicine

## 2021-10-08 DIAGNOSIS — I7121 Aneurysm of the ascending aorta, without rupture: Secondary | ICD-10-CM

## 2021-10-25 ENCOUNTER — Ambulatory Visit
Admission: RE | Admit: 2021-10-25 | Discharge: 2021-10-25 | Disposition: A | Discharge status: Home / Self Care | Payer: Medicare Other | Source: Ambulatory Visit | Attending: Internal Medicine | Admitting: Internal Medicine

## 2021-10-25 DIAGNOSIS — I712 Thoracic aortic aneurysm, without rupture, unspecified: Secondary | ICD-10-CM | POA: Diagnosis not present

## 2021-10-25 DIAGNOSIS — S2242XA Multiple fractures of ribs, left side, initial encounter for closed fracture: Secondary | ICD-10-CM | POA: Diagnosis not present

## 2021-10-25 DIAGNOSIS — K449 Diaphragmatic hernia without obstruction or gangrene: Secondary | ICD-10-CM | POA: Diagnosis not present

## 2021-10-25 DIAGNOSIS — J439 Emphysema, unspecified: Secondary | ICD-10-CM | POA: Diagnosis not present

## 2021-10-25 DIAGNOSIS — I7121 Aneurysm of the ascending aorta, without rupture: Secondary | ICD-10-CM

## 2021-10-25 MED ORDER — IOPAMIDOL (ISOVUE-370) INJECTION 76%
75.0000 mL | Freq: Once | INTRAVENOUS | Status: AC | PRN
  Administered 2021-10-25: 75 mL via INTRAVENOUS

## 2021-12-04 ENCOUNTER — Other Ambulatory Visit: Payer: Self-pay | Admitting: Student

## 2021-12-05 ENCOUNTER — Other Ambulatory Visit: Payer: Self-pay | Admitting: Student

## 2021-12-05 ENCOUNTER — Other Ambulatory Visit (HOSPITAL_COMMUNITY): Payer: Self-pay | Admitting: Student

## 2021-12-05 DIAGNOSIS — M79605 Pain in left leg: Secondary | ICD-10-CM

## 2021-12-06 ENCOUNTER — Other Ambulatory Visit (HOSPITAL_COMMUNITY): Payer: Self-pay | Admitting: Student

## 2021-12-06 ENCOUNTER — Ambulatory Visit (HOSPITAL_COMMUNITY)
Admission: RE | Admit: 2021-12-06 | Discharge: 2021-12-06 | Disposition: A | Discharge status: Home / Self Care | Payer: No Typology Code available for payment source | Source: Ambulatory Visit | Attending: Student | Admitting: Student

## 2021-12-06 DIAGNOSIS — M79605 Pain in left leg: Secondary | ICD-10-CM | POA: Diagnosis present

## 2021-12-06 DIAGNOSIS — M79662 Pain in left lower leg: Secondary | ICD-10-CM | POA: Diagnosis not present

## 2021-12-06 LAB — POCT I-STAT CREATININE: Creatinine, Ser: 0.8 mg/dL (ref 0.61–1.24)

## 2021-12-06 MED ORDER — IOHEXOL 300 MG/ML  SOLN
100.0000 mL | Freq: Once | INTRAMUSCULAR | Status: AC | PRN
  Administered 2021-12-06: 100 mL via INTRAVENOUS

## 2021-12-12 ENCOUNTER — Other Ambulatory Visit: Payer: Self-pay | Admitting: Internal Medicine

## 2021-12-12 DIAGNOSIS — I7121 Aneurysm of the ascending aorta, without rupture: Secondary | ICD-10-CM

## 2021-12-21 ENCOUNTER — Other Ambulatory Visit (HOSPITAL_COMMUNITY): Payer: Self-pay | Admitting: Orthopedic Surgery

## 2021-12-26 ENCOUNTER — Encounter (HOSPITAL_BASED_OUTPATIENT_CLINIC_OR_DEPARTMENT_OTHER): Payer: Self-pay | Admitting: Orthopedic Surgery

## 2021-12-26 ENCOUNTER — Other Ambulatory Visit: Payer: Self-pay

## 2022-01-10 ENCOUNTER — Encounter (HOSPITAL_BASED_OUTPATIENT_CLINIC_OR_DEPARTMENT_OTHER): Payer: Self-pay | Admitting: Orthopedic Surgery

## 2022-01-10 ENCOUNTER — Other Ambulatory Visit: Payer: Self-pay

## 2022-01-10 ENCOUNTER — Ambulatory Visit (HOSPITAL_BASED_OUTPATIENT_CLINIC_OR_DEPARTMENT_OTHER)
Admission: RE | Admit: 2022-01-10 | Discharge: 2022-01-10 | Disposition: A | Discharge status: Home / Self Care | Payer: No Typology Code available for payment source | Source: Ambulatory Visit | Attending: Orthopedic Surgery | Admitting: Orthopedic Surgery

## 2022-01-10 ENCOUNTER — Ambulatory Visit (HOSPITAL_BASED_OUTPATIENT_CLINIC_OR_DEPARTMENT_OTHER): Payer: No Typology Code available for payment source | Admitting: Anesthesiology

## 2022-01-10 ENCOUNTER — Encounter (HOSPITAL_BASED_OUTPATIENT_CLINIC_OR_DEPARTMENT_OTHER)
Admission: RE | Disposition: A | Discharge status: Home / Self Care | Payer: Self-pay | Source: Ambulatory Visit | Attending: Orthopedic Surgery

## 2022-01-10 DIAGNOSIS — Z01818 Encounter for other preprocedural examination: Secondary | ICD-10-CM

## 2022-01-10 DIAGNOSIS — Z89511 Acquired absence of right leg below knee: Secondary | ICD-10-CM | POA: Insufficient documentation

## 2022-01-10 DIAGNOSIS — L97821 Non-pressure chronic ulcer of other part of left lower leg limited to breakdown of skin: Secondary | ICD-10-CM | POA: Diagnosis not present

## 2022-01-10 DIAGNOSIS — Z472 Encounter for removal of internal fixation device: Secondary | ICD-10-CM

## 2022-01-10 DIAGNOSIS — Z87891 Personal history of nicotine dependence: Secondary | ICD-10-CM | POA: Diagnosis not present

## 2022-01-10 DIAGNOSIS — L089 Local infection of the skin and subcutaneous tissue, unspecified: Secondary | ICD-10-CM | POA: Diagnosis not present

## 2022-01-10 DIAGNOSIS — L905 Scar conditions and fibrosis of skin: Secondary | ICD-10-CM | POA: Diagnosis not present

## 2022-01-10 DIAGNOSIS — I96 Gangrene, not elsewhere classified: Secondary | ICD-10-CM | POA: Diagnosis not present

## 2022-01-10 DIAGNOSIS — G709 Myoneural disorder, unspecified: Secondary | ICD-10-CM

## 2022-01-10 DIAGNOSIS — L97929 Non-pressure chronic ulcer of unspecified part of left lower leg with unspecified severity: Secondary | ICD-10-CM

## 2022-01-10 DIAGNOSIS — K279 Peptic ulcer, site unspecified, unspecified as acute or chronic, without hemorrhage or perforation: Secondary | ICD-10-CM

## 2022-01-10 DIAGNOSIS — M86662 Other chronic osteomyelitis, left tibia and fibula: Secondary | ICD-10-CM

## 2022-01-10 DIAGNOSIS — Z8711 Personal history of peptic ulcer disease: Secondary | ICD-10-CM | POA: Diagnosis not present

## 2022-01-10 HISTORY — PX: INCISION AND DRAINAGE OF WOUND: SHX1803

## 2022-01-10 SURGERY — IRRIGATION AND DEBRIDEMENT WOUND
Anesthesia: General | Laterality: Left

## 2022-01-10 MED ORDER — BUPIVACAINE-EPINEPHRINE (PF) 0.5% -1:200000 IJ SOLN
INTRAMUSCULAR | Status: AC
  Filled 2022-01-10: qty 30

## 2022-01-10 MED ORDER — SODIUM CHLORIDE 0.9 % IR SOLN
Status: DC | PRN
  Administered 2022-01-10: 3000 mL

## 2022-01-10 MED ORDER — 0.9 % SODIUM CHLORIDE (POUR BTL) OPTIME
TOPICAL | Status: DC | PRN
  Administered 2022-01-10: 200 mL

## 2022-01-10 MED ORDER — VANCOMYCIN HCL 500 MG IV SOLR
INTRAVENOUS | Status: AC
  Filled 2022-01-10: qty 10

## 2022-01-10 MED ORDER — CEFAZOLIN SODIUM-DEXTROSE 2-4 GM/100ML-% IV SOLN
INTRAVENOUS | Status: AC
  Filled 2022-01-10: qty 100

## 2022-01-10 MED ORDER — DEXMEDETOMIDINE HCL IN NACL 80 MCG/20ML IV SOLN
INTRAVENOUS | Status: AC
  Filled 2022-01-10: qty 20

## 2022-01-10 MED ORDER — VANCOMYCIN HCL 500 MG IV SOLR
INTRAVENOUS | Status: DC | PRN
  Administered 2022-01-10: 500 mg via TOPICAL

## 2022-01-10 MED ORDER — PHENYLEPHRINE 80 MCG/ML (10ML) SYRINGE FOR IV PUSH (FOR BLOOD PRESSURE SUPPORT)
PREFILLED_SYRINGE | INTRAVENOUS | Status: AC
Start: 2022-01-10 — End: ?
  Filled 2022-01-10: qty 20

## 2022-01-10 MED ORDER — LIDOCAINE HCL (CARDIAC) PF 100 MG/5ML IV SOSY
PREFILLED_SYRINGE | INTRAVENOUS | Status: DC | PRN
  Administered 2022-01-10: 20 mg via INTRAVENOUS

## 2022-01-10 MED ORDER — ACETAMINOPHEN 500 MG PO TABS
1000.0000 mg | ORAL_TABLET | Freq: Once | ORAL | Status: AC
  Administered 2022-01-10: 1000 mg via ORAL

## 2022-01-10 MED ORDER — SODIUM CHLORIDE 0.9 % IV SOLN: INTRAVENOUS | Status: DC

## 2022-01-10 MED ORDER — ONDANSETRON HCL 4 MG/2ML IJ SOLN
INTRAMUSCULAR | Status: DC | PRN
  Administered 2022-01-10: 4 mg via INTRAVENOUS

## 2022-01-10 MED ORDER — DEXAMETHASONE SODIUM PHOSPHATE 10 MG/ML IJ SOLN
INTRAMUSCULAR | Status: DC | PRN
  Administered 2022-01-10: 120 mg via INTRAVENOUS
  Administered 2022-01-10: 4 mg via INTRAVENOUS

## 2022-01-10 MED ORDER — ACETAMINOPHEN 500 MG PO TABS
ORAL_TABLET | ORAL | Status: AC
  Filled 2022-01-10: qty 2

## 2022-01-10 MED ORDER — FENTANYL CITRATE (PF) 100 MCG/2ML IJ SOLN
INTRAMUSCULAR | Status: DC | PRN
Start: 2022-01-10 — End: 2022-01-10
  Administered 2022-01-10: 50 ug via INTRAVENOUS

## 2022-01-10 MED ORDER — BUPIVACAINE-EPINEPHRINE 0.5% -1:200000 IJ SOLN
INTRAMUSCULAR | Status: DC | PRN
  Administered 2022-01-10: 20 mL

## 2022-01-10 MED ORDER — CEFAZOLIN SODIUM-DEXTROSE 2-3 GM-%(50ML) IV SOLR
INTRAVENOUS | Status: DC | PRN
  Administered 2022-01-10: 2 g via INTRAVENOUS

## 2022-01-10 MED ORDER — PROPOFOL 500 MG/50ML IV EMUL
INTRAVENOUS | Status: DC | PRN
  Administered 2022-01-10: 25 ug/kg/min via INTRAVENOUS

## 2022-01-10 MED ORDER — LACTATED RINGERS IV SOLN: INTRAVENOUS | Status: DC

## 2022-01-10 MED ORDER — FENTANYL CITRATE (PF) 100 MCG/2ML IJ SOLN
INTRAMUSCULAR | Status: AC
  Filled 2022-01-10: qty 2

## 2022-01-10 SURGICAL SUPPLY — 69 items
APL PRP STRL LF DISP 70% ISPRP (MISCELLANEOUS) ×1
BANDAGE ESMARK 6X9 LF (GAUZE/BANDAGES/DRESSINGS) IMPLANT
BLADE SURG 15 STRL LF DISP TIS (BLADE) ×2 IMPLANT
BLADE SURG 15 STRL SS (BLADE) ×4
BNDG CMPR 5X3 CHSV STRCH STRL (GAUZE/BANDAGES/DRESSINGS)
BNDG CMPR 9X4 STRL LF SNTH (GAUZE/BANDAGES/DRESSINGS)
BNDG CMPR 9X6 STRL LF SNTH (GAUZE/BANDAGES/DRESSINGS)
BNDG COHESIVE 3X5 TAN ST LF (GAUZE/BANDAGES/DRESSINGS) IMPLANT
BNDG ELASTIC 4X5.8 VLCR STR LF (GAUZE/BANDAGES/DRESSINGS) ×1 IMPLANT
BNDG ELASTIC 6X5.8 VLCR STR LF (GAUZE/BANDAGES/DRESSINGS) ×1 IMPLANT
BNDG ESMARK 4X9 LF (GAUZE/BANDAGES/DRESSINGS) IMPLANT
BNDG ESMARK 6X9 LF (GAUZE/BANDAGES/DRESSINGS)
BNDG GAUZE DERMACEA FLUFF (GAUZE/BANDAGES/DRESSINGS)
BNDG GAUZE DERMACEA FLUFF 4 (GAUZE/BANDAGES/DRESSINGS) IMPLANT
BNDG GZE DERMACEA 4 6PLY (GAUZE/BANDAGES/DRESSINGS)
CANISTER SUCT 1200ML W/VALVE (MISCELLANEOUS) ×2 IMPLANT
CHLORAPREP W/TINT 26 (MISCELLANEOUS) ×1 IMPLANT
CNTNR URN SCR LID CUP LEK RST (MISCELLANEOUS) IMPLANT
CONT SPEC 4OZ STRL OR WHT (MISCELLANEOUS) ×6
COVER BACK TABLE 60X90IN (DRAPES) ×2 IMPLANT
CUFF TOURN SGL QUICK 24 (TOURNIQUET CUFF)
CUFF TOURN SGL QUICK 34 (TOURNIQUET CUFF)
CUFF TRNQT CYL 24X4X16.5-23 (TOURNIQUET CUFF) IMPLANT
CUFF TRNQT CYL 34X4.125X (TOURNIQUET CUFF) IMPLANT
DRAPE EXTREMITY T 121X128X90 (DISPOSABLE) ×2 IMPLANT
DRAPE INCISE IOBAN 66X45 STRL (DRAPES) IMPLANT
DRAPE OEC MINIVIEW 54X84 (DRAPES) ×1 IMPLANT
DRAPE SURG 17X23 STRL (DRAPES) IMPLANT
DRAPE U-SHAPE 47X51 STRL (DRAPES) ×2 IMPLANT
DRSG MEPITEL 4X7.2 (GAUZE/BANDAGES/DRESSINGS) ×2 IMPLANT
DRSG PAD ABDOMINAL 8X10 ST (GAUZE/BANDAGES/DRESSINGS) ×1 IMPLANT
ELECT REM PT RETURN 9FT ADLT (ELECTROSURGICAL) ×2
ELECTRODE REM PT RTRN 9FT ADLT (ELECTROSURGICAL) ×1 IMPLANT
GLOVE BIO SURGEON STRL SZ8 (GLOVE) ×2 IMPLANT
GLOVE BIOGEL PI IND STRL 8 (GLOVE) ×2 IMPLANT
GLOVE BIOGEL PI INDICATOR 8 (GLOVE) ×1
GLOVE ECLIPSE 8.0 STRL XLNG CF (GLOVE) ×1 IMPLANT
GOWN STRL REUS W/ TWL LRG LVL3 (GOWN DISPOSABLE) ×1 IMPLANT
GOWN STRL REUS W/ TWL XL LVL3 (GOWN DISPOSABLE) ×2 IMPLANT
GOWN STRL REUS W/TWL LRG LVL3 (GOWN DISPOSABLE) ×2
GOWN STRL REUS W/TWL XL LVL3 (GOWN DISPOSABLE) ×4
MANIFOLD NEPTUNE II (INSTRUMENTS) ×2 IMPLANT
NEEDLE HYPO 22GX1.5 SAFETY (NEEDLE) IMPLANT
PACK BASIN DAY SURGERY FS (CUSTOM PROCEDURE TRAY) ×2 IMPLANT
PAD CAST 4YDX4 CTTN HI CHSV (CAST SUPPLIES) ×2 IMPLANT
PADDING CAST COTTON 4X4 STRL (CAST SUPPLIES) ×4
PENCIL SMOKE EVACUATOR (MISCELLANEOUS) ×2 IMPLANT
SANITIZER HAND PURELL 535ML FO (MISCELLANEOUS) ×1 IMPLANT
SET IRRIG Y TYPE TUR BLADDER L (SET/KITS/TRAYS/PACK) ×2 IMPLANT
SHEET MEDIUM DRAPE 40X70 STRL (DRAPES) ×2 IMPLANT
SPLINT FAST PLASTER 5X30 (CAST SUPPLIES)
SPLINT PLASTER CAST FAST 5X30 (CAST SUPPLIES) IMPLANT
SPONGE T-LAP 18X18 ~~LOC~~+RFID (SPONGE) ×2 IMPLANT
STOCKINETTE 6  STRL (DRAPES) ×2
STOCKINETTE 6 STRL (DRAPES) ×1 IMPLANT
SUCTION FRAZIER HANDLE 10FR (MISCELLANEOUS)
SUCTION TUBE FRAZIER 10FR DISP (MISCELLANEOUS) IMPLANT
SUT ETHILON 3 0 PS 1 (SUTURE) ×1 IMPLANT
SUT VIC AB 2-0 SH 27 (SUTURE)
SUT VIC AB 2-0 SH 27XBRD (SUTURE) IMPLANT
SWAB COLLECTION DEVICE MRSA (MISCELLANEOUS) ×1 IMPLANT
SWAB CULTURE ESWAB REG 1ML (MISCELLANEOUS) ×1 IMPLANT
SYR BULB EAR ULCER 3OZ GRN STR (SYRINGE) ×2 IMPLANT
SYR CONTROL 10ML LL (SYRINGE) ×1 IMPLANT
TOWEL GREEN STERILE FF (TOWEL DISPOSABLE) ×2 IMPLANT
TRAY DSU PREP LF (CUSTOM PROCEDURE TRAY) IMPLANT
TUBE CONNECTING 20X1/4 (TUBING) ×2 IMPLANT
UNDERPAD 30X36 HEAVY ABSORB (UNDERPADS AND DIAPERS) ×2 IMPLANT
YANKAUER SUCT BULB TIP NO VENT (SUCTIONS) ×2 IMPLANT

## 2022-01-10 NOTE — Anesthesia Procedure Notes (Signed)
Procedure Name: LMA Insertion Date/Time: 01/10/2022 12:03 PM  Performed by: Cutberto Winfree, Ernesta Amble, CRNAPre-anesthesia Checklist: Patient identified, Emergency Drugs available, Suction available and Patient being monitored Patient Re-evaluated:Patient Re-evaluated prior to induction Oxygen Delivery Method: Circle system utilized Preoxygenation: Pre-oxygenation with 100% oxygen Induction Type: IV induction Ventilation: Mask ventilation without difficulty LMA: LMA inserted LMA Size: 4.0 Number of attempts: 1 Airway Equipment and Method: Bite block Placement Confirmation: positive ETCO2 Tube secured with: Tape Dental Injury: Teeth and Oropharynx as per pre-operative assessment
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
## 2022-01-10 NOTE — Transfer of Care (Signed)
Immediate Anesthesia Transfer of Care Note  Patient: Allen Taylor  Procedure(s) Performed: IRRIGATION AND EXCISIONAL DEBRIDEMENT WOUND LEFT LEG  X 3, BIOPSY AND CULTURES OF DEEP TISSUE (Left)  Patient Location: PACU  Anesthesia Type:General  Level of Consciousness: awake, alert , oriented and patient cooperative  Airway & Oxygen Therapy: Patient Spontanous Breathing and Patient connected to face mask oxygen  Post-op Assessment: Report given to RN and Post -op Vital signs reviewed and stable  Post vital signs: Reviewed and stable  Last Vitals:  Vitals Value Taken Time  BP    Temp    Pulse    Resp    SpO2      Last Pain:  Vitals:   01/10/22 1054  TempSrc: Oral  PainSc: 0-No pain         Complications: No notable events documented.

## 2022-01-10 NOTE — Discharge Instructions (Addendum)
Wylene Simmer, MD [facility]  Please read the following information regarding your care after surgery.  Medications  You only need a prescription for the narcotic pain medicine (ex. oxycodone, Percocet, Norco).  All of the other medicines listed below are available over the counter. X Aleve 2 pills twice a day for the first 3 days after surgery. X acetominophen (Tylenol) 650 mg every 4-6 hours as you need for minor to moderate pain  Weight Bearing X Bear weight when you are able on your operated leg or foot.  Cast / Splint / Dressing X Remove your dressing 3 days after surgery and cover the incisions with dry dressings.    After your dressing, cast or splint is removed; you may shower, but do not soak or scrub the wound.  Allow the water to run over it, and then gently pat it dry.  Swelling It is normal for you to have swelling where you had surgery.  To reduce swelling and pain, keep your toes above your nose for at least 3 days after surgery.  It may be necessary to keep your foot or leg elevated for several weeks.  If it hurts, it should be elevated.  Follow Up Call my office at [phone] when you are discharged from the hospital or surgery center to schedule an appointment to be seen two weeks after surgery.  Call my office at [phone] if you develop a fever >101.5 F, nausea, vomiting, bleeding from the surgical site or severe pain.      No Tylenol before 5pm   Post Anesthesia Home Care Instructions  Activity: Get plenty of rest for the remainder of the day. A responsible individual must stay with you for 24 hours following the procedure.  For the next 24 hours, DO NOT: -Drive a car -Paediatric nurse -Drink alcoholic beverages -Take any medication unless instructed by your physician -Make any legal decisions or sign important papers.  Meals: Start with liquid foods such as gelatin or soup. Progress to regular foods as tolerated. Avoid greasy, spicy, heavy foods.  If nausea and/or vomiting occur, drink only clear liquids until the nausea and/or vomiting subsides. Call your physician if vomiting continues.  Special Instructions/Symptoms: Your throat may feel dry or sore from the anesthesia or the breathing tube placed in your throat during surgery. If this causes discomfort, gargle with warm salt water. The discomfort should disappear within 24 hours.  If you had a scopolamine patch placed behind your ear for the management of post- operative nausea and/or vomiting:  1. The medication in the patch is effective for 72 hours, after which it should be removed.  Wrap patch in a tissue and discard in the trash. Wash hands thoroughly with soap and water. 2. You may remove the patch earlier than 72 hours if you experience unpleasant side effects which may include dry mouth, dizziness or visual disturbances. 3. Avoid touching the patch. Wash your hands with soap and water after contact with the patch.
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
## 2022-01-10 NOTE — Anesthesia Preprocedure Evaluation (Signed)
Anesthesia Evaluation  Patient identified by MRN, date of birth, ID band Patient awake    Reviewed: Allergy & Precautions, NPO status , Patient's Chart, lab work & pertinent test results  Airway Mallampati: II  TM Distance: >3 FB Neck ROM: Full    Dental no notable dental hx.    Pulmonary neg pulmonary ROS, former smoker,    Pulmonary exam normal breath sounds clear to auscultation       Cardiovascular negative cardio ROS Normal cardiovascular exam Rhythm:Regular Rate:Normal     Neuro/Psych Anxiety  Neuromuscular disease (carpal tunnel) negative psych ROS   GI/Hepatic Neg liver ROS, PUD, Chronic ulcerative rectosigmoiditis   Endo/Other  negative endocrine ROS  Renal/GU negative Renal ROS  negative genitourinary   Musculoskeletal  (+) Arthritis , Osteoarthritis,    Abdominal   Peds negative pediatric ROS (+)  Hematology negative hematology ROS (+)   Anesthesia Other Findings Traumatic Right BKA  Reproductive/Obstetrics negative OB ROS                             Anesthesia Physical Anesthesia Plan  ASA: 3  Anesthesia Plan: General   Post-op Pain Management:    Induction: Intravenous  PONV Risk Score and Plan: 2 and Treatment may vary due to age or medical condition, Ondansetron and Dexamethasone  Airway Management Planned: LMA  Additional Equipment: None  Intra-op Plan:   Post-operative Plan: Extubation in OR  Informed Consent: I have reviewed the patients History and Physical, chart, labs and discussed the procedure including the risks, benefits and alternatives for the proposed anesthesia with the patient or authorized representative who has indicated his/her understanding and acceptance.     Dental advisory given  Plan Discussed with: CRNA, Anesthesiologist and Surgeon  Anesthesia Plan Comments:         Anesthesia Quick Evaluation

## 2022-01-10 NOTE — Anesthesia Postprocedure Evaluation (Signed)
Anesthesia Post Note  Patient: Allen Taylor  Procedure(s) Performed: IRRIGATION AND EXCISIONAL DEBRIDEMENT WOUND LEFT LEG  X 3, BIOPSY AND CULTURES OF DEEP TISSUE (Left)     Patient location during evaluation: PACU Anesthesia Type: General Level of consciousness: awake Pain management: pain level controlled Vital Signs Assessment: post-procedure vital signs reviewed and stable Respiratory status: spontaneous breathing and respiratory function stable Cardiovascular status: stable Postop Assessment: no apparent nausea or vomiting Anesthetic complications: no   No notable events documented.  Last Vitals:  Vitals:   01/10/22 1315 01/10/22 1337  BP: 133/84 114/83  Pulse: 75 74  Resp: 10   Temp:  (!) 36.4 C  SpO2: 96% 94%    Last Pain:  Vitals:   01/10/22 1337  TempSrc: Oral  PainSc: 0-No pain                 Merlinda Frederick

## 2022-01-11 ENCOUNTER — Encounter (HOSPITAL_BASED_OUTPATIENT_CLINIC_OR_DEPARTMENT_OTHER): Payer: Self-pay | Admitting: Orthopedic Surgery

## 2022-01-11 LAB — SURGICAL PATHOLOGY

## 2022-01-15 LAB — AEROBIC/ANAEROBIC CULTURE W GRAM STAIN (SURGICAL/DEEP WOUND)
Culture: NO GROWTH
Culture: NO GROWTH
Gram Stain: NONE SEEN
Gram Stain: NONE SEEN
Gram Stain: NONE SEEN

## 2022-01-23 ENCOUNTER — Telehealth: Payer: Self-pay | Admitting: Infectious Disease

## 2022-01-23 NOTE — Telephone Encounter (Signed)
[name] called re this very complicated patient with concern by him and the patient and that patient did not think he needs to be seen.  Can me make sure the patient knows that he DOES have an appt and knows where our clinic is and how to come early esp with the parking and that [name] and I have already discussed his case again on the phone

## 2022-01-23 NOTE — Telephone Encounter (Signed)
Spoke with [name], he confirms he will be here on 8/1 and has our location.   Beryle Flock, RN

## 2022-01-29 ENCOUNTER — Ambulatory Visit (HOSPITAL_BASED_OUTPATIENT_CLINIC_OR_DEPARTMENT_OTHER): Payer: Medicare Other | Admitting: Infectious Disease

## 2022-01-29 ENCOUNTER — Other Ambulatory Visit: Payer: Self-pay

## 2022-01-29 ENCOUNTER — Encounter: Payer: Self-pay | Admitting: Infectious Disease

## 2022-01-29 VITALS — BP 143/87 | HR 81 | Temp 97.3°F | Resp 16 | Wt 191.8 lb

## 2022-01-29 DIAGNOSIS — A498 Other bacterial infections of unspecified site: Secondary | ICD-10-CM

## 2022-01-29 DIAGNOSIS — S91002A Unspecified open wound, left ankle, initial encounter: Secondary | ICD-10-CM

## 2022-01-29 DIAGNOSIS — T8454XA Infection and inflammatory reaction due to internal left knee prosthesis, initial encounter: Secondary | ICD-10-CM

## 2022-01-29 DIAGNOSIS — M86662 Other chronic osteomyelitis, left tibia and fibula: Secondary | ICD-10-CM

## 2022-01-29 DIAGNOSIS — S81802A Unspecified open wound, left lower leg, initial encounter: Secondary | ICD-10-CM

## 2022-01-29 DIAGNOSIS — T847XXA Infection and inflammatory reaction due to other internal orthopedic prosthetic devices, implants and grafts, initial encounter: Secondary | ICD-10-CM

## 2022-01-29 DIAGNOSIS — S81002A Unspecified open wound, left knee, initial encounter: Secondary | ICD-10-CM

## 2022-01-29 HISTORY — DX: Other bacterial infections of unspecified site: A49.8

## 2022-01-29 HISTORY — DX: Infection and inflammatory reaction due to other internal orthopedic prosthetic devices, implants and grafts, initial encounter: T84.7XXA

## 2022-01-29 MED ORDER — CIPROFLOXACIN HCL 750 MG PO TABS
750.0000 mg | ORAL_TABLET | Freq: Two times a day (BID) | ORAL | 11 refills | Status: DC
Start: 2022-01-29 — End: 2022-03-12
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
## 2022-01-30 LAB — CBC WITH DIFFERENTIAL/PLATELET
Absolute Monocytes: 874 cells/uL (ref 200–950)
Basophils Absolute: 67 cells/uL (ref 0–200)
Basophils Relative: 0.6 %
Eosinophils Absolute: 504 cells/uL — ABNORMAL HIGH (ref 15–500)
Eosinophils Relative: 4.5 %
HCT: 41.4 % (ref 38.5–50.0)
Hemoglobin: 13.4 g/dL (ref 13.2–17.1)
Lymphs Abs: 1848 cells/uL (ref 850–3900)
MCH: 28.2 pg (ref 27.0–33.0)
MCHC: 32.4 g/dL (ref 32.0–36.0)
MCV: 87.2 fL (ref 80.0–100.0)
MPV: 8.9 fL (ref 7.5–12.5)
Monocytes Relative: 7.8 %
Neutro Abs: 7907 cells/uL — ABNORMAL HIGH (ref 1500–7800)
Neutrophils Relative %: 70.6 %
Platelets: 491 10*3/uL — ABNORMAL HIGH (ref 140–400)
RBC: 4.75 10*6/uL (ref 4.20–5.80)
RDW: 12.7 % (ref 11.0–15.0)
Total Lymphocyte: 16.5 %
WBC: 11.2 10*3/uL — ABNORMAL HIGH (ref 3.8–10.8)

## 2022-01-30 LAB — COMPLETE METABOLIC PANEL WITH GFR
AG Ratio: 1.4 (calc) (ref 1.0–2.5)
ALT: 13 U/L (ref 9–46)
AST: 13 U/L (ref 10–35)
Albumin: 3.7 g/dL (ref 3.6–5.1)
Alkaline phosphatase (APISO): 80 U/L (ref 35–144)
BUN: 19 mg/dL (ref 7–25)
CO2: 26 mmol/L (ref 20–32)
Calcium: 9.1 mg/dL (ref 8.6–10.3)
Chloride: 103 mmol/L (ref 98–110)
Creat: 0.71 mg/dL (ref 0.70–1.28)
Globulin: 2.7 g/dL (calc) (ref 1.9–3.7)
Glucose, Bld: 100 mg/dL — ABNORMAL HIGH (ref 65–99)
Potassium: 4.4 mmol/L (ref 3.5–5.3)
Sodium: 138 mmol/L (ref 135–146)
Total Bilirubin: 0.2 mg/dL (ref 0.2–1.2)
Total Protein: 6.4 g/dL (ref 6.1–8.1)
eGFR: 94 mL/min/{1.73_m2} (ref >=60)

## 2022-01-30 LAB — C-REACTIVE PROTEIN: CRP: 11.7 mg/L — ABNORMAL HIGH (ref <8.0)

## 2022-01-30 LAB — SEDIMENTATION RATE: Sed Rate: 34 mm/h — ABNORMAL HIGH (ref 0–20)

## 2022-02-11 ENCOUNTER — Encounter: Payer: Self-pay | Admitting: Orthopaedic Surgery

## 2022-03-12 ENCOUNTER — Other Ambulatory Visit: Payer: Self-pay

## 2022-03-12 ENCOUNTER — Encounter: Payer: Self-pay | Admitting: Infectious Disease

## 2022-03-12 ENCOUNTER — Ambulatory Visit (INDEPENDENT_AMBULATORY_CARE_PROVIDER_SITE_OTHER): Payer: No Typology Code available for payment source | Admitting: Infectious Disease

## 2022-03-12 VITALS — BP 156/98 | HR 76 | Temp 97.6°F | Wt 191.0 lb

## 2022-03-12 DIAGNOSIS — T847XXD Infection and inflammatory reaction due to other internal orthopedic prosthetic devices, implants and grafts, subsequent encounter: Secondary | ICD-10-CM

## 2022-03-12 DIAGNOSIS — B965 Pseudomonas (aeruginosa) (mallei) (pseudomallei) as the cause of diseases classified elsewhere: Secondary | ICD-10-CM

## 2022-03-12 DIAGNOSIS — M86662 Other chronic osteomyelitis, left tibia and fibula: Secondary | ICD-10-CM

## 2022-03-12 DIAGNOSIS — A498 Other bacterial infections of unspecified site: Secondary | ICD-10-CM

## 2022-03-12 MED ORDER — CIPROFLOXACIN HCL 750 MG PO TABS: 750.0000 mg | ORAL_TABLET | Freq: Two times a day (BID) | ORAL | 11 refills | Status: DC
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
## 2022-03-13 LAB — CBC WITH DIFFERENTIAL/PLATELET
Absolute Monocytes: 677 cells/uL (ref 200–950)
Basophils Absolute: 66 cells/uL (ref 0–200)
Basophils Relative: 0.7 %
Eosinophils Absolute: 320 cells/uL (ref 15–500)
Eosinophils Relative: 3.4 %
HCT: 39.9 % (ref 38.5–50.0)
Hemoglobin: 12.8 g/dL — ABNORMAL LOW (ref 13.2–17.1)
Lymphs Abs: 1344 cells/uL (ref 850–3900)
MCH: 27.5 pg (ref 27.0–33.0)
MCHC: 32.1 g/dL (ref 32.0–36.0)
MCV: 85.8 fL (ref 80.0–100.0)
MPV: 9.4 fL (ref 7.5–12.5)
Monocytes Relative: 7.2 %
Neutro Abs: 6994 cells/uL (ref 1500–7800)
Neutrophils Relative %: 74.4 %
Platelets: 433 10*3/uL — ABNORMAL HIGH (ref 140–400)
RBC: 4.65 10*6/uL (ref 4.20–5.80)
RDW: 13 % (ref 11.0–15.0)
Total Lymphocyte: 14.3 %
WBC: 9.4 10*3/uL (ref 3.8–10.8)

## 2022-03-13 LAB — BASIC METABOLIC PANEL WITH GFR
BUN/Creatinine Ratio: 39 (calc) — ABNORMAL HIGH (ref 6–22)
BUN: 21 mg/dL (ref 7–25)
CO2: 29 mmol/L (ref 20–32)
Calcium: 8.9 mg/dL (ref 8.6–10.3)
Chloride: 101 mmol/L (ref 98–110)
Creat: 0.54 mg/dL — ABNORMAL LOW (ref 0.70–1.28)
Glucose, Bld: 98 mg/dL (ref 65–99)
Potassium: 4.2 mmol/L (ref 3.5–5.3)
Sodium: 138 mmol/L (ref 135–146)
eGFR: 102 mL/min/{1.73_m2} (ref >=60)

## 2022-03-13 LAB — SEDIMENTATION RATE: Sed Rate: 48 mm/h — ABNORMAL HIGH (ref 0–20)

## 2022-03-13 LAB — C-REACTIVE PROTEIN: CRP: 76.4 mg/L — ABNORMAL HIGH (ref <8.0)

## 2022-04-17 ENCOUNTER — Encounter: Payer: Self-pay | Admitting: Infectious Disease

## 2022-04-17 ENCOUNTER — Other Ambulatory Visit: Payer: Self-pay

## 2022-04-17 ENCOUNTER — Ambulatory Visit (INDEPENDENT_AMBULATORY_CARE_PROVIDER_SITE_OTHER): Payer: No Typology Code available for payment source | Admitting: Infectious Disease

## 2022-04-17 VITALS — BP 144/87 | HR 98 | Resp 16 | Ht 66.0 in | Wt 188.2 lb

## 2022-04-17 DIAGNOSIS — T847XXD Infection and inflammatory reaction due to other internal orthopedic prosthetic devices, implants and grafts, subsequent encounter: Secondary | ICD-10-CM | POA: Diagnosis not present

## 2022-04-17 DIAGNOSIS — A498 Other bacterial infections of unspecified site: Secondary | ICD-10-CM | POA: Diagnosis not present

## 2022-04-17 DIAGNOSIS — Z7185 Encounter for immunization safety counseling: Secondary | ICD-10-CM | POA: Diagnosis not present

## 2022-04-17 DIAGNOSIS — M86662 Other chronic osteomyelitis, left tibia and fibula: Secondary | ICD-10-CM

## 2022-04-17 HISTORY — DX: Encounter for immunization safety counseling: Z71.85
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
## 2022-04-18 ENCOUNTER — Telehealth: Payer: Self-pay

## 2022-04-18 ENCOUNTER — Other Ambulatory Visit: Payer: Self-pay | Admitting: Infectious Disease

## 2022-04-18 DIAGNOSIS — D7219 Other eosinophilia: Secondary | ICD-10-CM

## 2022-04-18 LAB — BASIC METABOLIC PANEL WITH GFR
BUN: 17 mg/dL (ref 7–25)
CO2: 27 mmol/L (ref 20–32)
Calcium: 9.1 mg/dL (ref 8.6–10.3)
Chloride: 104 mmol/L (ref 98–110)
Creat: 0.73 mg/dL (ref 0.70–1.28)
Glucose, Bld: 123 mg/dL — ABNORMAL HIGH (ref 65–99)
Potassium: 4.3 mmol/L (ref 3.5–5.3)
Sodium: 141 mmol/L (ref 135–146)
eGFR: 93 mL/min/{1.73_m2} (ref >=60)

## 2022-04-18 LAB — CBC WITH DIFFERENTIAL/PLATELET
Absolute Monocytes: 701 cells/uL (ref 200–950)
Basophils Absolute: 73 cells/uL (ref 0–200)
Basophils Relative: 0.8 %
Eosinophils Absolute: 764 cells/uL — ABNORMAL HIGH (ref 15–500)
Eosinophils Relative: 8.4 %
HCT: 43 % (ref 38.5–50.0)
Hemoglobin: 13.9 g/dL (ref 13.2–17.1)
Lymphs Abs: 1429 cells/uL (ref 850–3900)
MCH: 26.7 pg — ABNORMAL LOW (ref 27.0–33.0)
MCHC: 32.3 g/dL (ref 32.0–36.0)
MCV: 82.7 fL (ref 80.0–100.0)
MPV: 9.3 fL (ref 7.5–12.5)
Monocytes Relative: 7.7 %
Neutro Abs: 6133 cells/uL (ref 1500–7800)
Neutrophils Relative %: 67.4 %
Platelets: 534 10*3/uL — ABNORMAL HIGH (ref 140–400)
RBC: 5.2 10*6/uL (ref 4.20–5.80)
RDW: 13.3 % (ref 11.0–15.0)
Total Lymphocyte: 15.7 %
WBC: 9.1 10*3/uL (ref 3.8–10.8)

## 2022-04-18 LAB — SEDIMENTATION RATE: Sed Rate: 22 mm/h — ABNORMAL HIGH (ref 0–20)

## 2022-04-18 LAB — C-REACTIVE PROTEIN: CRP: 5.3 mg/L (ref <8.0)

## 2022-04-18 NOTE — Telephone Encounter (Signed)
-----   Message from Truman Hayward, MD sent at 04/18/2022  8:26 AM EDT ----- Noticed he has had some eosinophilia not always. It may be worth screening him for stronyloides infection given [place] He would have to come in for a blood test for this ----- Message ----- From: Buel Ream, [facility] Results In Sent: 04/17/2022   3:43 PM EDT To: Truman Hayward, MD

## 2022-04-18 NOTE — Telephone Encounter (Signed)
Patient aware. Patient scheduled for lab visit on 10/25.

## 2022-04-22 ENCOUNTER — Other Ambulatory Visit: Payer: Self-pay

## 2022-04-23 ENCOUNTER — Encounter (INDEPENDENT_AMBULATORY_CARE_PROVIDER_SITE_OTHER): Payer: Medicare Other | Admitting: Ophthalmology

## 2022-04-23 DIAGNOSIS — H35412 Lattice degeneration of retina, left eye: Secondary | ICD-10-CM | POA: Diagnosis not present

## 2022-04-23 DIAGNOSIS — H33321 Round hole, right eye: Secondary | ICD-10-CM | POA: Diagnosis not present

## 2022-04-23 DIAGNOSIS — H43392 Other vitreous opacities, left eye: Secondary | ICD-10-CM | POA: Diagnosis not present

## 2022-04-23 DIAGNOSIS — H43811 Vitreous degeneration, right eye: Secondary | ICD-10-CM | POA: Diagnosis not present

## 2022-04-23 DIAGNOSIS — H33322 Round hole, left eye: Secondary | ICD-10-CM | POA: Diagnosis not present

## 2022-04-24 ENCOUNTER — Other Ambulatory Visit: Payer: No Typology Code available for payment source

## 2022-04-24 ENCOUNTER — Other Ambulatory Visit: Payer: Self-pay

## 2022-04-24 DIAGNOSIS — D7219 Other eosinophilia: Secondary | ICD-10-CM

## 2022-05-01 LAB — STRONGYLOIDES ANTIBODY: Strongyloides IgG Antibody, ELISA: NEGATIVE

## 2022-05-02 ENCOUNTER — Telehealth: Payer: Self-pay

## 2022-05-02 NOTE — Telephone Encounter (Signed)
-----   Message from Truman Hayward, MD sent at 05/02/2022  8:48 AM EDT ----- No strongyloides so that is good ----- Message ----- From: Cheyenne Adas Lab Results In Sent: 05/01/2022   7:23 PM EDT To: Truman Hayward, MD

## 2022-05-14 DIAGNOSIS — Z1211 Encounter for screening for malignant neoplasm of colon: Secondary | ICD-10-CM | POA: Diagnosis not present

## 2022-05-14 DIAGNOSIS — R739 Hyperglycemia, unspecified: Secondary | ICD-10-CM | POA: Diagnosis not present

## 2022-05-14 DIAGNOSIS — R7989 Other specified abnormal findings of blood chemistry: Secondary | ICD-10-CM | POA: Diagnosis not present

## 2022-05-14 DIAGNOSIS — E785 Hyperlipidemia, unspecified: Secondary | ICD-10-CM | POA: Diagnosis not present

## 2022-05-14 DIAGNOSIS — D509 Iron deficiency anemia, unspecified: Secondary | ICD-10-CM | POA: Diagnosis not present

## 2022-05-21 DIAGNOSIS — S88111D Complete traumatic amputation at level between knee and ankle, right lower leg, subsequent encounter: Secondary | ICD-10-CM | POA: Diagnosis not present

## 2022-05-21 DIAGNOSIS — R82998 Other abnormal findings in urine: Secondary | ICD-10-CM | POA: Diagnosis not present

## 2022-05-21 DIAGNOSIS — D509 Iron deficiency anemia, unspecified: Secondary | ICD-10-CM | POA: Diagnosis not present

## 2022-05-21 DIAGNOSIS — Z0000 Encounter for general adult medical examination without abnormal findings: Secondary | ICD-10-CM | POA: Diagnosis not present

## 2022-05-21 DIAGNOSIS — G4700 Insomnia, unspecified: Secondary | ICD-10-CM | POA: Diagnosis not present

## 2022-05-21 DIAGNOSIS — F419 Anxiety disorder, unspecified: Secondary | ICD-10-CM | POA: Diagnosis not present

## 2022-05-21 DIAGNOSIS — R739 Hyperglycemia, unspecified: Secondary | ICD-10-CM | POA: Diagnosis not present

## 2022-05-21 DIAGNOSIS — E785 Hyperlipidemia, unspecified: Secondary | ICD-10-CM | POA: Diagnosis not present

## 2022-05-21 DIAGNOSIS — M25572 Pain in left ankle and joints of left foot: Secondary | ICD-10-CM | POA: Diagnosis not present

## 2022-05-21 DIAGNOSIS — Z23 Encounter for immunization: Secondary | ICD-10-CM | POA: Diagnosis not present

## 2022-06-18 DIAGNOSIS — H31092 Other chorioretinal scars, left eye: Secondary | ICD-10-CM | POA: Diagnosis not present

## 2022-06-18 DIAGNOSIS — H26493 Other secondary cataract, bilateral: Secondary | ICD-10-CM | POA: Diagnosis not present

## 2022-06-18 DIAGNOSIS — H0102A Squamous blepharitis right eye, upper and lower eyelids: Secondary | ICD-10-CM | POA: Diagnosis not present

## 2022-06-18 DIAGNOSIS — Z961 Presence of intraocular lens: Secondary | ICD-10-CM | POA: Diagnosis not present

## 2022-06-18 DIAGNOSIS — H0102B Squamous blepharitis left eye, upper and lower eyelids: Secondary | ICD-10-CM | POA: Diagnosis not present

## 2022-07-03 DIAGNOSIS — H26492 Other secondary cataract, left eye: Secondary | ICD-10-CM | POA: Diagnosis not present

## 2022-07-10 ENCOUNTER — Other Ambulatory Visit (HOSPITAL_COMMUNITY): Payer: Self-pay | Admitting: Orthopedic Surgery

## 2022-07-18 ENCOUNTER — Other Ambulatory Visit: Payer: Self-pay

## 2022-07-18 ENCOUNTER — Encounter (HOSPITAL_BASED_OUTPATIENT_CLINIC_OR_DEPARTMENT_OTHER): Payer: Self-pay | Admitting: Orthopedic Surgery

## 2022-07-19 NOTE — Progress Notes (Signed)

## 2022-07-22 DIAGNOSIS — H33312 Horseshoe tear of retina without detachment, left eye: Secondary | ICD-10-CM | POA: Diagnosis not present

## 2022-07-22 DIAGNOSIS — H35412 Lattice degeneration of retina, left eye: Secondary | ICD-10-CM | POA: Diagnosis not present

## 2022-07-22 DIAGNOSIS — H33322 Round hole, left eye: Secondary | ICD-10-CM | POA: Diagnosis not present

## 2022-07-22 DIAGNOSIS — H43811 Vitreous degeneration, right eye: Secondary | ICD-10-CM | POA: Diagnosis not present

## 2022-07-22 DIAGNOSIS — H33321 Round hole, right eye: Secondary | ICD-10-CM | POA: Diagnosis not present

## 2022-07-22 DIAGNOSIS — H43392 Other vitreous opacities, left eye: Secondary | ICD-10-CM | POA: Diagnosis not present

## 2022-07-25 ENCOUNTER — Telehealth: Payer: Self-pay | Admitting: Infectious Disease

## 2022-07-25 ENCOUNTER — Ambulatory Visit (HOSPITAL_BASED_OUTPATIENT_CLINIC_OR_DEPARTMENT_OTHER): Payer: No Typology Code available for payment source | Admitting: Certified Registered"

## 2022-07-25 ENCOUNTER — Ambulatory Visit (HOSPITAL_BASED_OUTPATIENT_CLINIC_OR_DEPARTMENT_OTHER): Payer: No Typology Code available for payment source

## 2022-07-25 ENCOUNTER — Other Ambulatory Visit: Payer: Self-pay

## 2022-07-25 ENCOUNTER — Encounter (HOSPITAL_BASED_OUTPATIENT_CLINIC_OR_DEPARTMENT_OTHER): Payer: Self-pay | Admitting: Orthopedic Surgery

## 2022-07-25 ENCOUNTER — Ambulatory Visit (HOSPITAL_BASED_OUTPATIENT_CLINIC_OR_DEPARTMENT_OTHER)
Admission: RE | Admit: 2022-07-25 | Discharge: 2022-07-25 | Disposition: A | Discharge status: Home / Self Care | Payer: No Typology Code available for payment source | Source: Ambulatory Visit | Attending: Orthopedic Surgery | Admitting: Orthopedic Surgery

## 2022-07-25 ENCOUNTER — Encounter (HOSPITAL_BASED_OUTPATIENT_CLINIC_OR_DEPARTMENT_OTHER)
Admission: RE | Disposition: A | Discharge status: Home / Self Care | Payer: Self-pay | Source: Ambulatory Visit | Attending: Orthopedic Surgery

## 2022-07-25 DIAGNOSIS — M869 Osteomyelitis, unspecified: Secondary | ICD-10-CM

## 2022-07-25 DIAGNOSIS — Y831 Surgical operation with implant of artificial internal device as the cause of abnormal reaction of the patient, or of later complication, without mention of misadventure at the time of the procedure: Secondary | ICD-10-CM | POA: Diagnosis not present

## 2022-07-25 DIAGNOSIS — Z792 Long term (current) use of antibiotics: Secondary | ICD-10-CM | POA: Insufficient documentation

## 2022-07-25 DIAGNOSIS — T8149XA Infection following a procedure, other surgical site, initial encounter: Secondary | ICD-10-CM

## 2022-07-25 DIAGNOSIS — D649 Anemia, unspecified: Secondary | ICD-10-CM | POA: Diagnosis not present

## 2022-07-25 DIAGNOSIS — Z6830 Body mass index (BMI) 30.0-30.9, adult: Secondary | ICD-10-CM

## 2022-07-25 DIAGNOSIS — Z87891 Personal history of nicotine dependence: Secondary | ICD-10-CM | POA: Diagnosis not present

## 2022-07-25 DIAGNOSIS — T8484XA Pain due to internal orthopedic prosthetic devices, implants and grafts, initial encounter: Secondary | ICD-10-CM

## 2022-07-25 DIAGNOSIS — Z89511 Acquired absence of right leg below knee: Secondary | ICD-10-CM | POA: Diagnosis not present

## 2022-07-25 DIAGNOSIS — F419 Anxiety disorder, unspecified: Secondary | ICD-10-CM | POA: Diagnosis not present

## 2022-07-25 DIAGNOSIS — L0889 Other specified local infections of the skin and subcutaneous tissue: Secondary | ICD-10-CM | POA: Insufficient documentation

## 2022-07-25 DIAGNOSIS — E669 Obesity, unspecified: Secondary | ICD-10-CM | POA: Diagnosis not present

## 2022-07-25 DIAGNOSIS — M7989 Other specified soft tissue disorders: Secondary | ICD-10-CM | POA: Diagnosis not present

## 2022-07-25 DIAGNOSIS — R739 Hyperglycemia, unspecified: Secondary | ICD-10-CM

## 2022-07-25 DIAGNOSIS — T847XXD Infection and inflammatory reaction due to other internal orthopedic prosthetic devices, implants and grafts, subsequent encounter: Secondary | ICD-10-CM

## 2022-07-25 HISTORY — PX: HARDWARE REMOVAL: SHX979

## 2022-07-25 SURGERY — REMOVAL, HARDWARE
Anesthesia: General | Site: Leg Lower | Laterality: Left

## 2022-07-25 MED ORDER — TRANEXAMIC ACID-NACL 1000-0.7 MG/100ML-% IV SOLN
INTRAVENOUS | Status: DC | PRN
  Administered 2022-07-25: 1000 mg via INTRAVENOUS

## 2022-07-25 MED ORDER — VANCOMYCIN HCL 500 MG IV SOLR
INTRAVENOUS | Status: DC | PRN
  Administered 2022-07-25: 500 mg via TOPICAL

## 2022-07-25 MED ORDER — DOCUSATE SODIUM 100 MG PO CAPS: 100.0000 mg | ORAL_CAPSULE | Freq: Two times a day (BID) | ORAL | 0 refills | Status: DC

## 2022-07-25 MED ORDER — FENTANYL CITRATE (PF) 100 MCG/2ML IJ SOLN: 25.0000 ug | INTRAMUSCULAR | Status: DC | PRN

## 2022-07-25 MED ORDER — ROPIVACAINE HCL 5 MG/ML IJ SOLN
INTRAMUSCULAR | Status: DC | PRN
  Administered 2022-07-25: 30 mL via PERINEURAL

## 2022-07-25 MED ORDER — TRANEXAMIC ACID-NACL 1000-0.7 MG/100ML-% IV SOLN
INTRAVENOUS | Status: AC
  Filled 2022-07-25: qty 100

## 2022-07-25 MED ORDER — DEXAMETHASONE SODIUM PHOSPHATE 10 MG/ML IJ SOLN
INTRAMUSCULAR | Status: DC | PRN
  Administered 2022-07-25: 4 mg via INTRAVENOUS

## 2022-07-25 MED ORDER — 0.9 % SODIUM CHLORIDE (POUR BTL) OPTIME
TOPICAL | Status: DC | PRN
  Administered 2022-07-25: 500 mL

## 2022-07-25 MED ORDER — ACETAMINOPHEN 500 MG PO TABS
1000.0000 mg | ORAL_TABLET | Freq: Once | ORAL | Status: AC
  Administered 2022-07-25: 1000 mg via ORAL

## 2022-07-25 MED ORDER — SODIUM CHLORIDE 0.9 % IV SOLN: INTRAVENOUS | Status: DC

## 2022-07-25 MED ORDER — OXYCODONE HCL 5 MG PO TABS: 5.0000 mg | ORAL_TABLET | ORAL | 0 refills | Status: AC | PRN

## 2022-07-25 MED ORDER — AMISULPRIDE (ANTIEMETIC) 5 MG/2ML IV SOLN: 10.0000 mg | Freq: Once | INTRAVENOUS | Status: DC | PRN

## 2022-07-25 MED ORDER — LIDOCAINE 2% (20 MG/ML) 5 ML SYRINGE
INTRAMUSCULAR | Status: DC | PRN
  Administered 2022-07-25: 30 mg via INTRAVENOUS

## 2022-07-25 MED ORDER — PHENYLEPHRINE HCL (PRESSORS) 10 MG/ML IV SOLN
INTRAVENOUS | Status: DC | PRN
  Administered 2022-07-25: 80 ug via INTRAVENOUS
  Administered 2022-07-25 (×3): 160 ug via INTRAVENOUS
  Administered 2022-07-25: 40 ug via INTRAVENOUS
  Administered 2022-07-25: 160 ug via INTRAVENOUS

## 2022-07-25 MED ORDER — LACTATED RINGERS IV SOLN: INTRAVENOUS | Status: DC

## 2022-07-25 MED ORDER — FENTANYL CITRATE (PF) 100 MCG/2ML IJ SOLN
100.0000 ug | Freq: Once | INTRAMUSCULAR | Status: AC
  Administered 2022-07-25: 50 ug via INTRAVENOUS

## 2022-07-25 MED ORDER — BUPIVACAINE-EPINEPHRINE (PF) 0.5% -1:200000 IJ SOLN
INTRAMUSCULAR | Status: AC
  Filled 2022-07-25: qty 30

## 2022-07-25 MED ORDER — CEFAZOLIN SODIUM-DEXTROSE 2-4 GM/100ML-% IV SOLN
INTRAVENOUS | Status: AC
  Filled 2022-07-25: qty 100

## 2022-07-25 MED ORDER — DEXAMETHASONE SODIUM PHOSPHATE 4 MG/ML IJ SOLN
INTRAMUSCULAR | Status: DC | PRN
  Administered 2022-07-25: 5 mg via PERINEURAL

## 2022-07-25 MED ORDER — PROPOFOL 10 MG/ML IV BOLUS
INTRAVENOUS | Status: DC | PRN
  Administered 2022-07-25: 120 mg via INTRAVENOUS

## 2022-07-25 MED ORDER — ONDANSETRON HCL 4 MG/2ML IJ SOLN
INTRAMUSCULAR | Status: DC | PRN
  Administered 2022-07-25: 4 mg via INTRAVENOUS

## 2022-07-25 MED ORDER — SENNA 8.6 MG PO TABS: 2.0000 | ORAL_TABLET | Freq: Two times a day (BID) | ORAL | 0 refills | Status: DC

## 2022-07-25 MED ORDER — PROPOFOL 500 MG/50ML IV EMUL
INTRAVENOUS | Status: DC | PRN
  Administered 2022-07-25: 25 ug/kg/min via INTRAVENOUS

## 2022-07-25 MED ORDER — FENTANYL CITRATE (PF) 100 MCG/2ML IJ SOLN
INTRAMUSCULAR | Status: AC
  Filled 2022-07-25: qty 2

## 2022-07-25 MED ORDER — ACETAMINOPHEN 500 MG PO TABS
ORAL_TABLET | ORAL | Status: AC
  Filled 2022-07-25: qty 2

## 2022-07-25 MED ORDER — EPHEDRINE SULFATE (PRESSORS) 50 MG/ML IJ SOLN
INTRAMUSCULAR | Status: DC | PRN
  Administered 2022-07-25 (×4): 10 mg via INTRAVENOUS

## 2022-07-25 MED ORDER — SODIUM CHLORIDE 0.9 % IR SOLN
Status: DC | PRN
  Administered 2022-07-25: 6000 mL

## 2022-07-25 MED ORDER — CLONIDINE HCL (ANALGESIA) 100 MCG/ML EP SOLN
EPIDURAL | Status: DC | PRN
  Administered 2022-07-25: 80 ug

## 2022-07-25 MED ORDER — VANCOMYCIN HCL 500 MG IV SOLR
INTRAVENOUS | Status: AC
  Filled 2022-07-25: qty 10

## 2022-07-25 SURGICAL SUPPLY — 69 items
APL PRP STRL LF DISP 70% ISPRP (MISCELLANEOUS) ×1
APL SKNCLS STERI-STRIP NONHPOA (GAUZE/BANDAGES/DRESSINGS)
BANDAGE ESMARK 6X9 LF (GAUZE/BANDAGES/DRESSINGS) IMPLANT
BENZOIN TINCTURE PRP APPL 2/3 (GAUZE/BANDAGES/DRESSINGS) IMPLANT
BLADE SURG 15 STRL LF DISP TIS (BLADE) ×2 IMPLANT
BLADE SURG 15 STRL SS (BLADE) ×2
BNDG CMPR 9X4 STRL LF SNTH (GAUZE/BANDAGES/DRESSINGS)
BNDG CMPR 9X6 STRL LF SNTH (GAUZE/BANDAGES/DRESSINGS)
BNDG ELASTIC 4X5.8 VLCR STR LF (GAUZE/BANDAGES/DRESSINGS) IMPLANT
BNDG ELASTIC 6X5.8 VLCR STR LF (GAUZE/BANDAGES/DRESSINGS) IMPLANT
BNDG ESMARK 4X9 LF (GAUZE/BANDAGES/DRESSINGS) IMPLANT
BNDG ESMARK 6X9 LF (GAUZE/BANDAGES/DRESSINGS)
BOOT STEPPER DURA LG (SOFTGOODS) IMPLANT
CHLORAPREP W/TINT 26 (MISCELLANEOUS) ×1 IMPLANT
CNTNR URN SCR LID CUP LEK RST (MISCELLANEOUS) IMPLANT
CONT SPEC 4OZ STRL OR WHT (MISCELLANEOUS)
COVER BACK TABLE 60X90IN (DRAPES) ×1 IMPLANT
CUFF TOURN SGL QUICK 34 (TOURNIQUET CUFF)
CUFF TRNQT CYL 34X4.125X (TOURNIQUET CUFF) IMPLANT
DRAPE EXTREMITY T 121X128X90 (DISPOSABLE) ×1 IMPLANT
DRAPE OEC MINIVIEW 54X84 (DRAPES) IMPLANT
DRAPE SURG 17X23 STRL (DRAPES) IMPLANT
DRAPE U-SHAPE 47X51 STRL (DRAPES) ×1 IMPLANT
DRSG MEPITEL 4X7.2 (GAUZE/BANDAGES/DRESSINGS) ×1 IMPLANT
ELECT REM PT RETURN 9FT ADLT (ELECTROSURGICAL) ×1
ELECTRODE REM PT RTRN 9FT ADLT (ELECTROSURGICAL) ×1 IMPLANT
GAUZE PAD ABD 8X10 STRL (GAUZE/BANDAGES/DRESSINGS) IMPLANT
GAUZE SPONGE 4X4 12PLY STRL (GAUZE/BANDAGES/DRESSINGS) ×1 IMPLANT
GLOVE BIO SURGEON STRL SZ8 (GLOVE) ×1 IMPLANT
GLOVE BIOGEL PI IND STRL 7.0 (GLOVE) IMPLANT
GLOVE BIOGEL PI IND STRL 8 (GLOVE) ×2 IMPLANT
GLOVE ECLIPSE 8.0 STRL XLNG CF (GLOVE) ×1 IMPLANT
GLOVE SURG SS PI 7.0 STRL IVOR (GLOVE) IMPLANT
GOWN STRL REUS W/ TWL LRG LVL3 (GOWN DISPOSABLE) ×1 IMPLANT
GOWN STRL REUS W/ TWL XL LVL3 (GOWN DISPOSABLE) ×2 IMPLANT
GOWN STRL REUS W/TWL LRG LVL3 (GOWN DISPOSABLE) ×1
GOWN STRL REUS W/TWL XL LVL3 (GOWN DISPOSABLE) ×2
MANIFOLD NEPTUNE II (INSTRUMENTS) ×1 IMPLANT
NEEDLE HYPO 22GX1.5 SAFETY (NEEDLE) IMPLANT
PACK BASIN DAY SURGERY FS (CUSTOM PROCEDURE TRAY) ×1 IMPLANT
PAD CAST 4YDX4 CTTN HI CHSV (CAST SUPPLIES) ×1 IMPLANT
PADDING CAST COTTON 4X4 STRL (CAST SUPPLIES) ×1
PADDING CAST COTTON 6X4 STRL (CAST SUPPLIES) IMPLANT
PENCIL SMOKE EVACUATOR (MISCELLANEOUS) ×1 IMPLANT
SANITIZER HAND PURELL FF 515ML (MISCELLANEOUS) ×1 IMPLANT
SET IRRIG Y TYPE TUR BLADDER L (SET/KITS/TRAYS/PACK) IMPLANT
SHEET MEDIUM DRAPE 40X70 STRL (DRAPES) ×1 IMPLANT
SLEEVE SCD COMPRESS KNEE MED (STOCKING) ×1 IMPLANT
SPIKE FLUID TRANSFER (MISCELLANEOUS) IMPLANT
SPLINT PLASTER CAST FAST 5X30 (CAST SUPPLIES) IMPLANT
SPONGE T-LAP 18X18 ~~LOC~~+RFID (SPONGE) ×1 IMPLANT
STOCKINETTE 6  STRL (DRAPES) ×1
STOCKINETTE 6 STRL (DRAPES) ×1 IMPLANT
STRIP CLOSURE SKIN 1/2X4 (GAUZE/BANDAGES/DRESSINGS) IMPLANT
SUCTION FRAZIER HANDLE 10FR (MISCELLANEOUS) ×1
SUCTION TUBE FRAZIER 10FR DISP (MISCELLANEOUS) IMPLANT
SUT ETHILON 3 0 PS 1 (SUTURE) IMPLANT
SUT MNCRL AB 3-0 PS2 18 (SUTURE) ×1 IMPLANT
SUT VIC AB 2-0 SH 18 (SUTURE) IMPLANT
SUT VIC AB 2-0 SH 27 (SUTURE) ×1
SUT VIC AB 2-0 SH 27XBRD (SUTURE) IMPLANT
SUT VICRYL 0 SH 27 (SUTURE) IMPLANT
SWAB COLLECTION DEVICE MRSA (MISCELLANEOUS) IMPLANT
SWAB CULTURE ESWAB REG 1ML (MISCELLANEOUS) IMPLANT
SYR BULB EAR ULCER 3OZ GRN STR (SYRINGE) ×1 IMPLANT
SYR CONTROL 10ML LL (SYRINGE) IMPLANT
TOWEL GREEN STERILE FF (TOWEL DISPOSABLE) ×1 IMPLANT
TUBE CONNECTING 20X1/4 (TUBING) IMPLANT
UNDERPAD 30X36 HEAVY ABSORB (UNDERPADS AND DIAPERS) ×1 IMPLANT
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
## 2022-07-25 NOTE — Anesthesia Procedure Notes (Signed)
Procedure Name: LMA Insertion Date/Time: 07/25/2022 11:10 AM  Performed by: Bentlie Catanzaro, Ernesta Amble, CRNAPre-anesthesia Checklist: Patient identified, Emergency Drugs available, Suction available and Patient being monitored Patient Re-evaluated:Patient Re-evaluated prior to induction Oxygen Delivery Method: Circle system utilized Preoxygenation: Pre-oxygenation with 100% oxygen Induction Type: IV induction Ventilation: Mask ventilation without difficulty LMA: LMA inserted LMA Size: 4.0 Number of attempts: 1 Airway Equipment and Method: Bite block Placement Confirmation: positive ETCO2 Tube secured with: Tape Dental Injury: Teeth and Oropharynx as per pre-operative assessment
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
## 2022-07-25 NOTE — Anesthesia Procedure Notes (Signed)
Anesthesia Regional Block: Popliteal block   Pre-Anesthetic Checklist: , timeout performed,  Correct Patient, Correct Site, Correct Laterality,  Correct Procedure, Correct Position, site marked,  Risks and benefits discussed,  Surgical consent,  Pre-op evaluation,  At surgeon's request and post-op pain management  Laterality: Lower and Left  Prep: chloraprep       Needles:  Injection technique: Single-shot  Needle Type: Stimiplex     Needle Length: 10cm  Needle Gauge: 21     Additional Needles:   Procedures:,,,, ultrasound used (permanent image in chart),,   Motor weakness within 5 minutes.  Narrative:  Start time: 07/25/2022 10:40 AM End time: 07/25/2022 11:00 AM Injection made incrementally with aspirations every 5 mL.  Performed by: Personally  Anesthesiologist: Nolon Nations, MD  Additional Notes: Nerve located and needle positioned with direct ultrasound guidance. Good perineural spread. Patient tolerated well.

## 2022-07-25 NOTE — Telephone Encounter (Signed)
I spoke with Allen Taylor and his wife and asked him to restart the cipro they are willing to do in person or video visit but I think that video will be easier for him. He should have an appt next Tuesday when cultures should be back  Wife's smart phone is better for a video visiit  [phone]

## 2022-07-25 NOTE — Progress Notes (Signed)
Assisted Dr. Lissa Hoard with left, popliteal, ultrasound guided block. Side rails up, monitors on throughout procedure. See vital signs in flow sheet. Tolerated Procedure well.

## 2022-07-25 NOTE — Anesthesia Preprocedure Evaluation (Signed)
Anesthesia Evaluation    Reviewed: Allergy & Precautions, Patient's Chart, lab work & pertinent test results, Unable to perform ROS - Chart review only  Airway Mallampati: II  TM Distance: >3 FB Neck ROM: Full    Dental  (+) Dental Advisory Given   Pulmonary neg pulmonary ROS, former smoker   Pulmonary exam normal breath sounds clear to auscultation       Cardiovascular negative cardio ROS Normal cardiovascular exam Rhythm:Regular Rate:Normal     Neuro/Psych Seizures -, Well Controlled,   Anxiety      Neuromuscular disease (carpal tunnel)  negative psych ROS   GI/Hepatic Neg liver ROS, PUD,,,Chronic ulcerative rectosigmoiditis   Endo/Other  negative endocrine ROS    Renal/GU negative Renal ROS     Musculoskeletal  (+) Arthritis , Osteoarthritis,    Abdominal  (+) + obese  Peds  Hematology  (+) Blood dyscrasia, anemia   Anesthesia Other Findings Traumatic Right BKA  Reproductive/Obstetrics                              Anesthesia Physical Anesthesia Plan  ASA: 3  Anesthesia Plan: General   Post-op Pain Management: Tylenol PO (pre-op)* and Regional block*   Induction: Intravenous  PONV Risk Score and Plan: 2 and Treatment may vary due to age or medical condition, Ondansetron and Dexamethasone  Airway Management Planned: LMA  Additional Equipment: None  Intra-op Plan:   Post-operative Plan: Extubation in OR  Informed Consent:   Plan Discussed with:   Anesthesia Plan Comments:          Anesthesia Quick Evaluation

## 2022-07-25 NOTE — Anesthesia Postprocedure Evaluation (Signed)
Anesthesia Post Note  Patient: SERAPHIM TROW  Procedure(s) Performed: HARDWARE REMOVAL LEFT FIBULA (Left: Leg Lower)     Patient location during evaluation: PACU Anesthesia Type: General and Regional Level of consciousness: awake Pain management: pain level controlled Vital Signs Assessment: post-procedure vital signs reviewed and stable Respiratory status: spontaneous breathing, nonlabored ventilation and respiratory function stable Cardiovascular status: blood pressure returned to baseline and stable Postop Assessment: no apparent nausea or vomiting Anesthetic complications: no   No notable events documented.  Last Vitals:  Vitals:   07/25/22 1301 07/25/22 1338  BP: 101/78 139/75  Pulse: 81 76  Resp: 15 18  Temp:  36.5 C  SpO2: 94% 97%    Last Pain:  Vitals:   07/25/22 1338  TempSrc: Oral  PainSc: 0-No pain                 Ashe Graybeal P Damonta Cossey

## 2022-07-25 NOTE — Transfer of Care (Signed)
Immediate Anesthesia Transfer of Care Note  Patient: Allen Taylor  Procedure(s) Performed: HARDWARE REMOVAL LEFT FIBULA (Left: Leg Lower)  Patient Location: PACU  Anesthesia Type:GA combined with regional for post-op pain  Level of Consciousness: awake, alert , oriented, and patient cooperative  Airway & Oxygen Therapy: Patient Spontanous Breathing and Patient connected to face mask oxygen  Post-op Assessment: Report given to RN and Post -op Vital signs reviewed and stable  Post vital signs: Reviewed and stable  Last Vitals:  Vitals Value Taken Time  BP    Temp    Pulse 86 07/25/22 1239  Resp    SpO2 97 % 07/25/22 1239  Vitals shown include unvalidated device data.  Last Pain:  Vitals:   07/25/22 1033  TempSrc: Oral  PainSc: 5       Patients Stated Pain Goal: 8 (20/91/98 0221)  Complications: No notable events documented.

## 2022-07-25 NOTE — Telephone Encounter (Signed)
I got a message from [name] while I was in [place] that he removed Allen Taylor's hardware which was encased in pus and infected bone which he removed.  Allen Taylor sent deep cultures.   Allen Taylor should go back on his high dose ciprofloxacin if he has not already done so while we await culture data.   Can someone make an appt for him next Monday or Tuesday and let the patient know I am thinking about him. Unfortunatley I can't see him myself since I am on the inpatient service covering 2 MD services for next 2 weeks

## 2022-07-25 NOTE — Discharge Instructions (Addendum)
Next dose of Tylenol due anytime after 5:15 today if needed  Wylene Simmer, MD [facility]  Please read the following information regarding your care after surgery.  Medications  You only need a prescription for the narcotic pain medicine (ex. oxycodone, Percocet, Norco).  All of the other medicines listed below are available over the counter. ? Aleve 2 pills twice a day for the first 3 days after surgery. ? acetominophen (Tylenol) 650 mg every 4-6 hours as you need for minor to moderate pain ? oxycodone as prescribed for severe pain  Narcotic pain medicine (ex. oxycodone, Percocet, Vicodin) will cause constipation.  To prevent this problem, take the following medicines while you are taking any pain medicine. ? docusate sodium (Colace) 100 mg twice a day ? senna (Senokot) 2 tablets twice a day  Weight Bearing ? Bear weight only on your operated foot in the CAM boot.   Cast / Splint / Dressing ? Keep your splint, cast or dressing clean and dry.  Don't put anything (coat hanger, pencil, etc) down inside of it.  If it gets damp, use a hair dryer on the cool setting to dry it.  If it gets soaked, call the office to schedule an appointment for a cast change.   After your dressing, cast or splint is removed; you may shower, but do not soak or scrub the wound.  Allow the water to run over it, and then gently pat it dry.  Swelling It is normal for you to have swelling where you had surgery.  To reduce swelling and pain, keep your toes above your nose for at least 3 days after surgery.  It may be necessary to keep your foot or leg elevated for several weeks.  If it hurts, it should be elevated.  Follow Up Call my office at [phone] when you are discharged from the hospital or surgery center to schedule an appointment to be seen two weeks after surgery.  Call my office at [phone] if you develop a fever >101.5 F, nausea, vomiting, bleeding from the surgical site or severe pain.      Post Anesthesia Home Care Instructions  Activity: Get plenty of rest for the remainder of the day. A responsible individual must stay with you for 24 hours following the procedure.  For the next 24 hours, DO NOT: -Drive a car -Paediatric nurse -Drink alcoholic beverages -Take any medication unless instructed by your physician -Make any legal decisions or sign important papers.  Meals: Start with liquid foods such as gelatin or soup. Progress to regular foods as tolerated. Avoid greasy, spicy, heavy foods. If nausea and/or vomiting occur, drink only clear liquids until the nausea and/or vomiting subsides. Call your physician if vomiting continues.  Special Instructions/Symptoms: Your throat may feel dry or sore from the anesthesia or the breathing tube placed in your throat during surgery. If this causes discomfort, gargle with warm salt water. The discomfort should disappear within 24 hours.  If you had a scopolamine patch placed behind your ear for the management of post- operative nausea and/or vomiting:  1. The medication in the patch is effective for 72 hours, after which it should be removed.  Wrap patch in a tissue and discard in the trash. Wash hands thoroughly with soap and water. 2. You may remove the patch earlier than 72 hours if you experience unpleasant side effects which may include dry mouth, dizziness or visual disturbances. 3. Avoid touching the patch. Wash your hands with soap and water after  contact with the patch.     Regional Anesthesia Blocks  1. Numbness or the inability to move the "blocked" extremity may last from 3-48 hours after placement. The length of time depends on the medication injected and your individual response to the medication. If the numbness is not going away after 48 hours, call your surgeon.  2. The extremity that is blocked will need to be protected until the numbness is gone and the  Strength has returned. Because you cannot feel it, you  will need to take extra care to avoid injury. Because it may be weak, you may have difficulty moving it or using it. You may not know what position it is in without looking at it while the block is in effect.  3. For blocks in the legs and feet, returning to weight bearing and walking needs to be done carefully. You will need to wait until the numbness is entirely gone and the strength has returned. You should be able to move your leg and foot normally before you try and bear weight or walk. You will need someone to be with you when you first try to ensure you do not fall and possibly risk injury.  4. Bruising and tenderness at the needle site are common side effects and will resolve in a few days.  5. Persistent numbness or new problems with movement should be communicated to the surgeon or the [facility] [phone] [place] [phone]).Regional Anesthesia Blocks  1. Numbness or the inability to move the "blocked" extremity may last from 3-48 hours after placement. The length of time depends on the medication injected and your individual response to the medication. If the numbness is not going away after 48 hours, call your surgeon.  2. The extremity that is blocked will need to be protected until the numbness is gone and the  Strength has returned. Because you cannot feel it, you will need to take extra care to avoid injury. Because it may be weak, you may have difficulty moving it or using it. You may not know what position it is in without looking at it while the block is in effect.  3. For blocks in the legs and feet, returning to weight bearing and walking needs to be done carefully. You will need to wait until the numbness is entirely gone and the strength has returned. You should be able to move your leg and foot normally before you try and bear weight or walk. You will need someone to be with you when you first try to ensure you do not fall and possibly risk  injury.  4. Bruising and tenderness at the needle site are common side effects and will resolve in a few days.  5. Persistent numbness or new problems with movement should be communicated to the surgeon or the [facility] [phone] [place] [phone]).

## 2022-07-26 ENCOUNTER — Encounter (HOSPITAL_BASED_OUTPATIENT_CLINIC_OR_DEPARTMENT_OTHER): Payer: Self-pay | Admitting: Orthopedic Surgery

## 2022-07-26 NOTE — Telephone Encounter (Signed)
Patient scheduled with Dr. Candiss Norse on 07/30/22.

## 2022-07-29 ENCOUNTER — Telehealth: Payer: Self-pay | Admitting: Infectious Disease

## 2022-07-29 NOTE — Telephone Encounter (Signed)
Pt called to speak with Dr. Tommy Medal regarding a biopsy report being sent from [name]. Once the report is received, pt would like to be informed if he needs to stay on his antibiotic. Pt can be reached at [phone]

## 2022-07-30 ENCOUNTER — Telehealth: Payer: Self-pay | Admitting: Infectious Disease

## 2022-07-30 DIAGNOSIS — M86369 Chronic multifocal osteomyelitis, unspecified tibia and fibula: Secondary | ICD-10-CM

## 2022-07-30 LAB — AEROBIC/ANAEROBIC CULTURE W GRAM STAIN (SURGICAL/DEEP WOUND)

## 2022-07-30 NOTE — Telephone Encounter (Signed)
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 Candiss Norse tomorrow--I hope but regardless he needs to get a pICC and get on antibiotics PRONTO or this is going to undo [facility] f the work of Dr Doran Durand and make him need another surgery that I am sure he would not want  With removal of the hardware and infected bone he FINALLY has a chance to cure this infection  If we need to directly admit him to faciliate PICC line placement we can go down that route as well since there is a delay. I have put in orders for IR guided PICC placement in the meantime.

## 2022-07-31 ENCOUNTER — Encounter (HOSPITAL_COMMUNITY): Payer: Self-pay

## 2022-07-31 ENCOUNTER — Inpatient Hospital Stay: Payer: Self-pay

## 2022-07-31 ENCOUNTER — Inpatient Hospital Stay (HOSPITAL_COMMUNITY)
Admission: RE | Admit: 2022-07-31 | Discharge: 2022-08-02 | DRG: 560 | Disposition: A | Discharge status: Home Health | Payer: No Typology Code available for payment source | Source: Ambulatory Visit | Attending: Internal Medicine | Admitting: Internal Medicine

## 2022-07-31 ENCOUNTER — Telehealth: Payer: Medicare Other | Admitting: Internal Medicine

## 2022-07-31 ENCOUNTER — Other Ambulatory Visit: Payer: Self-pay | Admitting: Internal Medicine

## 2022-07-31 ENCOUNTER — Encounter: Payer: Self-pay | Admitting: Internal Medicine

## 2022-07-31 ENCOUNTER — Other Ambulatory Visit: Payer: Self-pay

## 2022-07-31 ENCOUNTER — Ambulatory Visit (INDEPENDENT_AMBULATORY_CARE_PROVIDER_SITE_OTHER): Payer: No Typology Code available for payment source | Admitting: Internal Medicine

## 2022-07-31 ENCOUNTER — Encounter (HOSPITAL_COMMUNITY): Payer: Self-pay | Admitting: Internal Medicine

## 2022-07-31 VITALS — BP 159/99 | HR 83 | Resp 16 | Ht 66.0 in | Wt 187.0 lb

## 2022-07-31 DIAGNOSIS — M19072 Primary osteoarthritis, left ankle and foot: Secondary | ICD-10-CM | POA: Diagnosis present

## 2022-07-31 DIAGNOSIS — M869 Osteomyelitis, unspecified: Secondary | ICD-10-CM | POA: Diagnosis not present

## 2022-07-31 DIAGNOSIS — Z9842 Cataract extraction status, left eye: Secondary | ICD-10-CM

## 2022-07-31 DIAGNOSIS — Z881 Allergy status to other antibiotic agents status: Secondary | ICD-10-CM | POA: Diagnosis not present

## 2022-07-31 DIAGNOSIS — G4700 Insomnia, unspecified: Secondary | ICD-10-CM | POA: Diagnosis present

## 2022-07-31 DIAGNOSIS — Y831 Surgical operation with implant of artificial internal device as the cause of abnormal reaction of the patient, or of later complication, without mention of misadventure at the time of the procedure: Secondary | ICD-10-CM | POA: Diagnosis present

## 2022-07-31 DIAGNOSIS — Z89519 Acquired absence of unspecified leg below knee: Secondary | ICD-10-CM

## 2022-07-31 DIAGNOSIS — A498 Other bacterial infections of unspecified site: Principal | ICD-10-CM

## 2022-07-31 DIAGNOSIS — Z981 Arthrodesis status: Secondary | ICD-10-CM

## 2022-07-31 DIAGNOSIS — E785 Hyperlipidemia, unspecified: Secondary | ICD-10-CM | POA: Diagnosis not present

## 2022-07-31 DIAGNOSIS — B965 Pseudomonas (aeruginosa) (mallei) (pseudomallei) as the cause of diseases classified elsewhere: Secondary | ICD-10-CM | POA: Diagnosis present

## 2022-07-31 DIAGNOSIS — Z1623 Resistance to quinolones and fluoroquinolones: Secondary | ICD-10-CM | POA: Diagnosis present

## 2022-07-31 DIAGNOSIS — Z85828 Personal history of other malignant neoplasm of skin: Secondary | ICD-10-CM

## 2022-07-31 DIAGNOSIS — Z9841 Cataract extraction status, right eye: Secondary | ICD-10-CM

## 2022-07-31 DIAGNOSIS — F32A Depression, unspecified: Secondary | ICD-10-CM | POA: Diagnosis present

## 2022-07-31 DIAGNOSIS — Z96642 Presence of left artificial hip joint: Secondary | ICD-10-CM | POA: Diagnosis present

## 2022-07-31 DIAGNOSIS — F419 Anxiety disorder, unspecified: Secondary | ICD-10-CM | POA: Diagnosis present

## 2022-07-31 DIAGNOSIS — Z87891 Personal history of nicotine dependence: Secondary | ICD-10-CM | POA: Diagnosis not present

## 2022-07-31 DIAGNOSIS — S81802A Unspecified open wound, left lower leg, initial encounter: Secondary | ICD-10-CM | POA: Diagnosis not present

## 2022-07-31 DIAGNOSIS — M86362 Chronic multifocal osteomyelitis, left tibia and fibula: Secondary | ICD-10-CM | POA: Diagnosis not present

## 2022-07-31 DIAGNOSIS — Z888 Allergy status to other drugs, medicaments and biological substances status: Secondary | ICD-10-CM

## 2022-07-31 DIAGNOSIS — Z79899 Other long term (current) drug therapy: Secondary | ICD-10-CM

## 2022-07-31 DIAGNOSIS — T84625A Infection and inflammatory reaction due to internal fixation device of left fibula, initial encounter: Secondary | ICD-10-CM | POA: Diagnosis present

## 2022-07-31 DIAGNOSIS — T847XXD Infection and inflammatory reaction due to other internal orthopedic prosthetic devices, implants and grafts, subsequent encounter: Secondary | ICD-10-CM | POA: Diagnosis not present

## 2022-07-31 DIAGNOSIS — Z8249 Family history of ischemic heart disease and other diseases of the circulatory system: Secondary | ICD-10-CM | POA: Diagnosis not present

## 2022-07-31 DIAGNOSIS — Z882 Allergy status to sulfonamides status: Secondary | ICD-10-CM | POA: Diagnosis not present

## 2022-07-31 DIAGNOSIS — T847XXA Infection and inflammatory reaction due to other internal orthopedic prosthetic devices, implants and grafts, initial encounter: Secondary | ICD-10-CM | POA: Diagnosis not present

## 2022-07-31 LAB — CBC WITH DIFFERENTIAL/PLATELET
Abs Immature Granulocytes: 0.03 10*3/uL (ref 0.00–0.07)
Basophils Absolute: 0.1 10*3/uL (ref 0.0–0.1)
Basophils Relative: 1 %
Eosinophils Absolute: 0.5 10*3/uL (ref 0.0–0.5)
Eosinophils Relative: 4 %
HCT: 45.8 % (ref 39.0–52.0)
Hemoglobin: 15.5 g/dL (ref 13.0–17.0)
Immature Granulocytes: 0 %
Lymphocytes Relative: 21 %
Lymphs Abs: 2.6 10*3/uL (ref 0.7–4.0)
MCH: 28.5 pg (ref 26.0–34.0)
MCHC: 33.8 g/dL (ref 30.0–36.0)
MCV: 84.3 fL (ref 80.0–100.0)
Monocytes Absolute: 1.1 10*3/uL — ABNORMAL HIGH (ref 0.1–1.0)
Monocytes Relative: 9 %
Neutro Abs: 8.3 10*3/uL — ABNORMAL HIGH (ref 1.7–7.7)
Neutrophils Relative %: 65 %
Platelets: 487 10*3/uL — ABNORMAL HIGH (ref 150–400)
RBC: 5.43 MIL/uL (ref 4.22–5.81)
RDW: 15.9 % — ABNORMAL HIGH (ref 11.5–15.5)
WBC: 12.5 10*3/uL — ABNORMAL HIGH (ref 4.0–10.5)
nRBC: 0 % (ref 0.0–0.2)

## 2022-07-31 LAB — COMPREHENSIVE METABOLIC PANEL
ALT: 16 U/L (ref 0–44)
AST: 26 U/L (ref 15–41)
Albumin: 3.7 g/dL (ref 3.5–5.0)
Alkaline Phosphatase: 68 U/L (ref 38–126)
Anion gap: 11 (ref 5–15)
BUN: 20 mg/dL (ref 8–23)
CO2: 22 mmol/L (ref 22–32)
Calcium: 9 mg/dL (ref 8.9–10.3)
Chloride: 105 mmol/L (ref 98–111)
Creatinine, Ser: 0.74 mg/dL (ref 0.61–1.24)
GFR, Estimated: >60 mL/min (ref >60)
Glucose, Bld: 102 mg/dL — ABNORMAL HIGH (ref 70–99)
Potassium: 4.3 mmol/L (ref 3.5–5.1)
Sodium: 138 mmol/L (ref 135–145)
Total Bilirubin: 0.4 mg/dL (ref 0.3–1.2)
Total Protein: 6.7 g/dL (ref 6.5–8.1)

## 2022-07-31 MED ORDER — ENOXAPARIN SODIUM 40 MG/0.4ML IJ SOSY
40.0000 mg | PREFILLED_SYRINGE | INTRAMUSCULAR | Status: DC
  Administered 2022-07-31 – 2022-08-01 (×2): 40 mg via SUBCUTANEOUS
  Filled 2022-07-31 (×2): qty 0.4

## 2022-07-31 MED ORDER — POLYETHYLENE GLYCOL 3350 17 G PO PACK: 17.0000 g | PACK | Freq: Every day | ORAL | Status: DC | PRN

## 2022-07-31 MED ORDER — ONDANSETRON HCL 4 MG/2ML IJ SOLN: 4.0000 mg | Freq: Four times a day (QID) | INTRAMUSCULAR | Status: DC | PRN

## 2022-07-31 MED ORDER — ACETAMINOPHEN 325 MG PO TABS: 650.0000 mg | ORAL_TABLET | Freq: Four times a day (QID) | ORAL | Status: DC | PRN

## 2022-07-31 MED ORDER — ADULT MULTIVITAMIN W/MINERALS CH
1.0000 | ORAL_TABLET | Freq: Every day | ORAL | Status: DC
  Administered 2022-07-31 – 2022-08-02 (×3): 1 via ORAL
  Filled 2022-07-31 (×3): qty 1

## 2022-07-31 MED ORDER — HYDROCODONE-ACETAMINOPHEN 5-325 MG PO TABS
1.0000 | ORAL_TABLET | Freq: Four times a day (QID) | ORAL | Status: DC | PRN
  Administered 2022-08-01 – 2022-08-02 (×6): 1 via ORAL
  Filled 2022-07-31 (×6): qty 1

## 2022-07-31 MED ORDER — ONDANSETRON HCL 4 MG PO TABS: 4.0000 mg | ORAL_TABLET | Freq: Four times a day (QID) | ORAL | Status: DC | PRN

## 2022-07-31 MED ORDER — SODIUM CHLORIDE 0.9 % IV SOLN
1.0000 g | Freq: Three times a day (TID) | INTRAVENOUS | Status: DC
  Administered 2022-07-31 – 2022-08-02 (×5): 1 g via INTRAVENOUS
  Filled 2022-07-31 (×6): qty 20

## 2022-07-31 MED ORDER — ACETAMINOPHEN 650 MG RE SUPP: 650.0000 mg | Freq: Four times a day (QID) | RECTAL | Status: DC | PRN

## 2022-07-31 NOTE — Progress Notes (Signed)
Pharmacy Antibiotic Note  Allen Taylor is a 77 y.o. male admitted as direct ID admit on 07/31/2022 with  Left lower extremity wound status post hardware removal with cultures growing fluoroquinolone resistant Pseudomonas . Pt has been on oral cipro. Earliest appt for PICC line placement 2/7 as outpt. Pharmacy has been consulted for Cefepime dosing. Planning for 6 weeks IV abx.  **Pt with listed allergy to cephalosporin (pt reports hives/aches/pains/swelling) when he took Keflex ~20 years ago** He endorses lots of worries about taking a drug from the same class to both hospitalist and myself. Discussed with hospitalist and decision made to change to Meropenem. ID to see in a.m.  Plan: Meropenem 1gm IV q8h Will f/u renal function, micro data, and pt's clinical condition F/u further recommendations by ID in a.m.     No data recorded.  No results for input(s): "WBC", "CREATININE", "LATICACIDVEN", "VANCOTROUGH", "VANCOPEAK", "VANCORANDOM", "GENTTROUGH", "GENTPEAK", "GENTRANDOM", "TOBRATROUGH", "TOBRAPEAK", "TOBRARND", "AMIKACINPEAK", "AMIKACINTROU", "AMIKACIN" in the last 168 hours.  CrCl cannot be calculated (Patient's most recent lab result is older than the maximum 21 days allowed.).    Allergies  Allergen Reactions   Cephalexin Swelling and Hives   Cephalosporins Nausea And Vomiting and Swelling    Feet swell   Hm Lidocaine Patch [Lidocaine]    Pravachol [Pravastatin]    Sulfamethoxazole Other (See Comments)   Zocor [Simvastatin] Other (See Comments)   Crestor [Rosuvastatin]     Has myalgias with several statins    Antimicrobials this admission: 1/31 Meropenem>>   Thank you for allowing pharmacy to be a part of this patient's care.  Sherlon Handing, PharmD, BCPS Please see amion for complete clinical pharmacist phone list 07/31/2022 8:53 PM

## 2022-07-31 NOTE — Assessment & Plan Note (Signed)
History of recent removal of hardware, cultures with Pseudomonas aeruginosa, partially resistant to ciprofloxacin.  History of allergies to cephalosporin. PICC line ordered as patient would like to continue antibiotics at home. -Started on meropenem

## 2022-07-31 NOTE — Telephone Encounter (Signed)
Reached out to IR regarding picc line placement. Earliest appointment for picc is 2/7 at [facility] and [facility].  Leatrice Jewels, RMA

## 2022-07-31 NOTE — Assessment & Plan Note (Signed)
-  Continue home meds °
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
## 2022-07-31 NOTE — Progress Notes (Signed)
Plan of Care Note for accepted direct admission   Patient: Allen Taylor MRN: [id number]   DOA: (Not on file)  Facility requesting transfer: [facility] Requesting Provider: Dr. Candiss Norse, infectious disease Reason for direct admission: Need for IV antibiotics  Facility course: Patient with known history of left lower extremity wound recently had I&D and hardware removal due to concern for hardware being source of infection.  Purulent drainage noted at that time.  Cultures have grown out Pseudomonas resistant to ciprofloxacin.  ID requesting direct admission for IV antibiotics as he is unable to get outpatient PICC line placed until February 7.  Patient is stable and appropriate for MedSurg bed at this time.  I have informed ID provider that it may be tomorrow before patient gets a bed and she states that he is stable for this at this time.  Patient is to be started on IV cefepime and 2 g every 8 hours on arrival, on-call ID provider is also to be consulted on arrival.  Plan of care: The patient is accepted for admission to [place]  unit, at [facility]..   Author: Marcelyn Bruins, MD 07/31/2022  Check [email or web address] for on-call coverage.  Nursing staff, Please call [place] number on Amion as soon as patient's arrival, so appropriate admitting provider can evaluate the pt.

## 2022-07-31 NOTE — Progress Notes (Deleted)
{  Select_TRH_Note:[id number]}

## 2022-08-01 DIAGNOSIS — A498 Other bacterial infections of unspecified site: Secondary | ICD-10-CM

## 2022-08-01 DIAGNOSIS — S81802A Unspecified open wound, left lower leg, initial encounter: Secondary | ICD-10-CM

## 2022-08-01 DIAGNOSIS — Z881 Allergy status to other antibiotic agents status: Secondary | ICD-10-CM

## 2022-08-01 DIAGNOSIS — M86362 Chronic multifocal osteomyelitis, left tibia and fibula: Secondary | ICD-10-CM

## 2022-08-01 DIAGNOSIS — T847XXA Infection and inflammatory reaction due to other internal orthopedic prosthetic devices, implants and grafts, initial encounter: Secondary | ICD-10-CM

## 2022-08-01 DIAGNOSIS — E785 Hyperlipidemia, unspecified: Secondary | ICD-10-CM | POA: Diagnosis not present

## 2022-08-01 LAB — SURGICAL PATHOLOGY

## 2022-08-01 MED ORDER — SODIUM CHLORIDE 0.9% FLUSH
10.0000 mL | Freq: Two times a day (BID) | INTRAVENOUS | Status: DC
  Administered 2022-08-01 – 2022-08-02 (×3): 10 mL

## 2022-08-01 MED ORDER — AMITRIPTYLINE HCL 50 MG PO TABS
100.0000 mg | ORAL_TABLET | Freq: Every evening | ORAL | Status: DC | PRN
  Administered 2022-08-01: 100 mg via ORAL
  Filled 2022-08-01: qty 2

## 2022-08-01 MED ORDER — CHLORHEXIDINE GLUCONATE CLOTH 2 % EX PADS
6.0000 | MEDICATED_PAD | Freq: Every day | CUTANEOUS | Status: DC
  Administered 2022-08-01 – 2022-08-02 (×2): 6 via TOPICAL

## 2022-08-01 MED ORDER — ALPRAZOLAM 0.5 MG PO TABS
0.5000 mg | ORAL_TABLET | Freq: Every evening | ORAL | Status: DC | PRN
  Administered 2022-08-01: 0.5 mg via ORAL
  Filled 2022-08-01: qty 1

## 2022-08-01 MED ORDER — COLESTIPOL HCL 1 G PO TABS
2.0000 g | ORAL_TABLET | Freq: Two times a day (BID) | ORAL | Status: DC
  Administered 2022-08-01 – 2022-08-02 (×3): 2 g via ORAL
  Filled 2022-08-01 (×4): qty 2

## 2022-08-01 MED ORDER — SODIUM CHLORIDE 0.9% FLUSH: 10.0000 mL | INTRAVENOUS | Status: DC | PRN

## 2022-08-01 NOTE — Progress Notes (Signed)
PHARMACY CONSULT NOTE FOR:  OUTPATIENT  PARENTERAL ANTIBIOTIC THERAPY (OPAT)  Indication: Pseudomonas aeruginosa osteomyelitis  Regimen: meropenem 1g IV Q8H  End date: 09/12/22 (6w from 08/01/22)  IV antibiotic discharge orders are pended. To discharging provider:  please sign these orders via discharge navigator,  Select New Orders & click on the button choice - Manage This Unsigned Work.     Thank you for allowing pharmacy to be a part of this patient's care.  Adria Dill, PharmD PGY-2 Infectious Diseases Resident  08/01/2022 9:35 AM
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
## 2022-08-01 NOTE — Progress Notes (Signed)
PROGRESS NOTE  Allen Taylor  DOB: Jul 03, 1945  PCP: Donnajean Lopes, MD GEX:[id number]  DOA: 07/31/2022  LOS: 1 day  Hospital Day: 2  Brief narrative: Allen Taylor is a 77 y.o. 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  Following the surgery, for decades, patient had intermittent drainage from the left leg ulcer associated with his injury.  He was off and on antibiotics for several years.  For the last few years, the drainage has been worsening and hence he decided to get surgical intervention. He was seen by orthopedics [name] who had discussed removing the hardware plate and A4 to cure deep infection.  He completed a 6-week course of oral ciprofloxacin with a plan of elective surgery for plate removal.   0/10, underwent removal of painful hardware of left fibula.  He was noted to have purulent drainage from the proximal fibula.  Wound cultures grew Pseudomonas resistant to ciprofloxacin.  He was seen by ID as an outpatient and plan for PICC line placement and IV antibiotics.  However, he could not get appointment from IR till 2/7 for PICC line placement and hence he was admitted on 1/31 with that plan. Will start on IV meropenem See below for details  Subjective: Patient was seen and examined this morning.  Pleasant elderly Caucasian male.  Sitting up in recliner.  Not in distress.  Pending PICC line placement today Chart reviewed.  Hemodynamically stable Labs from this morning with WBC count 12.5.  Otherwise unremarkable  Assessment and plan: Chronic left lower extremity wound  s/p hardware removal 1/25 Pseudomonas in wound culture resistant to ciprofloxacin -PICC line ordered as patient would like to continue antibiotics at home. -Started on meropenem  Hyperlipidemia Continue colestipol  Anxiety/depression Continue PRN  Elavil, PRN Xanax   Mobility: Encourage ambulation  Goals of care   Code Status: Full Code     DVT prophylaxis:  enoxaparin (LOVENOX) injection 40 mg Start: 07/31/22 2200   Antimicrobials: IV meropenem Fluid: None Consultants: ID Family Communication: None at bedside  Status is: Inpatient Level of care: Med-Surg   Dispo: Patient is from: Home              Anticipated d/c is to: Home this afternoon versus tomorrow Continue in-hospital care because: Needs PICC line placement and home health antibiotic arranged.   Scheduled Meds:  colestipol  2 g Oral BID   enoxaparin (LOVENOX) injection  40 mg Subcutaneous Q24H   multivitamin with minerals  1 tablet Oral Daily    PRN meds: acetaminophen **OR** acetaminophen, ALPRAZolam, amitriptyline, HYDROcodone-acetaminophen, ondansetron **OR** ondansetron (ZOFRAN) IV, polyethylene glycol   Infusions:   meropenem (MERREM) IV 1 g (08/01/22 2725)    Diet:  Diet Order             Diet regular Room service appropriate? Yes; Fluid consistency: Thin  Diet effective now                   Antimicrobials: Anti-infectives (From admission, onward)    Start     Dose/Rate Route Frequency Ordered Stop   07/31/22 2300  meropenem (MERREM) 1 g in sodium chloride 0.9 % 100 mL IVPB        1 g 200 mL/hr over 30 Minutes Intravenous Every 8 hours 07/31/22 2205         Skin  assessment:       Nutritional status:  Body mass index is 30.17 kg/m.          Objective: Vitals:   08/01/22 0615 08/01/22 0743  BP: 120/73 112/77  Pulse: 63 63  Resp: 17 18  Temp: 98 F (36.7 C) (!) 97.3 F (36.3 C)  SpO2: 99% 97%    Intake/Output Summary (Last 24 hours) at 08/01/2022 1132 Last data filed at 08/01/2022 2706 Gross per 24 hour  Intake 100 ml  Output 375 ml  Net -275 ml   Filed Weights   07/31/22 2158  Weight: 84.8 kg   Weight change:  Body mass index is 30.17 kg/m.   Physical Exam: General exam: Pleasant elderly  Caucasian male.  Not in distress Skin: No rashes, lesions or ulcers. HEENT: Atraumatic, normocephalic, no obvious bleeding Lungs: Clear to auscultation bilaterally CVS: Regular rate and rhythm, no murmur GI/Abd soft, nontender, nondistended, bowel sound present CNS: Alert, awake, oriented x 3 Psychiatry: Mood appropriate Extremities: No pedal edema, no calf tenderness  Data Review: I have personally reviewed the laboratory data and studies available.  F/u labs ordered Unresulted Labs (From admission, onward)     Start     Ordered   08/07/22 0500  Creatinine, serum  (enoxaparin (LOVENOX)    CrCl >/= 30 ml/min)  Weekly,   R     Comments: while on enoxaparin therapy    07/31/22 2048   07/31/22 2044  CBC  (enoxaparin (LOVENOX)    CrCl >/= 30 ml/min)  Once,   R       Comments: Baseline for enoxaparin therapy IF NOT ALREADY DRAWN.  Notify MD if PLT < 100 K.    07/31/22 2048            Total time spent in review of labs and imaging, patient evaluation, formulation of plan, documentation and communication with family: 21 minutes  Signed, Terrilee Croak, MD [facility] 08/01/2022

## 2022-08-01 NOTE — Progress Notes (Signed)
Peripherally Inserted Central Catheter Placement  The IV Nurse has discussed with the patient and/or persons authorized to consent for the patient, the purpose of this procedure and the potential benefits and risks involved with this procedure.  The benefits include less needle sticks, lab draws from the catheter, and the patient may be discharged home with the catheter. Risks include, but not limited to, infection, bleeding, blood clot (thrombus formation), and puncture of an artery; nerve damage and irregular heartbeat and possibility to perform a PICC exchange if needed/ordered by physician.  Alternatives to this procedure were also discussed.  Bard Power PICC patient education guide, fact sheet on infection prevention and patient information card has been provided to patient /or left at bedside.    PICC Placement Documentation  PICC Single Lumen 02/54/27 Right Basilic 36 cm 0 cm (Active)  Indication for Insertion or Continuance of Line Home intravenous therapies (PICC only) 08/01/22 1231  Exposed Catheter (cm) 0 cm 08/01/22 1231  Site Assessment Clean, Dry, Intact 08/01/22 1231  Line Status Flushed;Saline locked;Blood return noted 08/01/22 1231  Dressing Type Transparent;Securing device 08/01/22 1231  Dressing Status Antimicrobial disc in place 08/01/22 1231  Safety Lock Not Applicable 12/22/74 2831  Line Care Connections checked and tightened 08/01/22 1231  Line Adjustment (NICU/IV Team Only) No 08/01/22 1231  Dressing Intervention New dressing 08/01/22 1231  Dressing Change Due 08/08/22 08/01/22 [place] 08/01/2022, 12:32 PM

## 2022-08-01 NOTE — TOC Initial Note (Signed)
Transition of Care [facility]) - Initial/Assessment Note    Patient Details  Name: Allen Taylor MRN: [id number] Date of Birth: 1945-09-21  Transition of Care [facility]) CM/SW Contact:    Curlene Labrum, RN Phone Number: 08/01/2022, 3:38 PM  Clinical Narrative:                 CM met with the patient and wife at the bedside to discuss transitions of care needs to home - pending discharge tomorrow - 08/02/22 once home IV antibiotics have been coordinated and insurance authorization has been approved through the [place] for the IV antibiotics.  Carolynn Sayers, RNCM with [facility] was referral and she plans to meet with the patient at 4 pm today for teaching.  PICC line was placed in right arm earlier in the day today.  Patient had recent surgery with [name] this week for Left lower leg - hardware.  I offered Medicare choice regarding home health services and the patient did not have a preference.  I called [facility] and Tommi Rumps, RNCM has accepted for [facility] RN services.  HH orders were placed to be co-signed by the attending MD.  The patient's wife is at bedside and will be present for the teaching.  Patient will discharge home tomorrow once insurance has been approved and IV antibiotics and delivery coordinated.  Expected Discharge Plan: [place] Barriers to Discharge: Continued Medical Work up   Patient Goals and CMS Choice Patient states their goals for this hospitalization and ongoing recovery are:: To get better and go home CMS Medicare.gov Compare Post Acute Care list provided to:: Patient Choice offered to / list presented to : Patient [place] ownership interest in [facility].provided to:: Patient    Expected Discharge Plan and Services   Discharge Planning Services: CM Consult Post Acute Care Choice: [place] arrangements for the past 2 months: [place]: RN, IV Antibiotics HH Agency: [facility],  [place] Date [facility] Agency Contacted: 08/01/22 Time HH Agency Contacted: 64 Representative spoke with at [place]: Tommi Rumps, [place] with [facility], [place] with [facility] for IV antibiotics  Prior Living Arrangements/Services Living arrangements for the past 2 months: Single Family Home Lives with:: Spouse Patient language and need for interpreter reviewed:: Yes Do you feel safe going back to the place where you live?: Yes      Need for Family Participation in Patient Care: Yes (Comment) Care giver support system in place?: Yes (comment) Current home services: DME (Right leg prosthesis) Criminal Activity/Legal Involvement Pertinent to Current Situation/Hospitalization: No - Comment as needed  Activities of Daily Living Home Assistive Devices/Equipment: Prosthesis ADL Screening (condition at time of admission) Patient's cognitive ability adequate to safely complete daily activities?: Yes Is the patient deaf or have difficulty hearing?: Yes Does the patient have difficulty seeing, even when wearing glasses/contacts?: Yes Does the patient have difficulty concentrating, remembering, or making decisions?: No Patient able to express need for assistance with ADLs?: Yes Does the patient have difficulty dressing or bathing?: No Independently performs ADLs?: Yes (appropriate for developmental age) Does the patient have difficulty walking or climbing stairs?: Yes Weakness of Legs: Right Weakness of Arms/Hands: None  Permission Sought/Granted Permission sought to share information with : Case Manager, Family Supports, Customer service manager Permission granted to share information with :  Yes, Verbal Permission Granted     Permission granted to share info w AGENCY: [facility] for IV antibiotics, [facility] Lemmings for [facility] RN  Permission granted to share info w Relationship: spouse     Emotional Assessment Appearance:: Appears stated age Attitude/Demeanor/Rapport: Gracious Affect  (typically observed): Accepting Orientation: : Oriented to Self, Oriented to Place, Oriented to  Time, Oriented to Situation Alcohol / Substance Use: Not Applicable Psych Involvement: No (comment)  Admission diagnosis:  Non-healing wound of left lower extremity [S81.802A] Patient Active Problem List   Diagnosis Date Noted   Non-healing wound of left lower extremity 07/31/2022   Vaccine counseling 62/56/3893   Hardware complicating wound infection ([place]) 01/29/2022   Pseudomonas infection 01/29/2022   Trigger little finger of left hand 08/09/2021   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 05/14/2021   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 ([place]) 73/42/8768   H/O gastric ulcer 05/14/2021   Family history of colon cancer 05/14/2021   Actinic keratosis 05/14/2021   Chronic low back pain 05/14/2021   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 ([place]) 11/57/2620   Encounter for fitting and adjustment of hearing aid 05/14/2021   Neoplasm of uncertain behavior of skin 05/14/2021   Noninfective gastroenteritis and colitis, unspecified 05/14/2021   Open wound of knee, leg, and ankle 05/14/2021   Other ill-defined and unknown causes of morbidity and mortality 05/14/2021   Other specified counseling 05/14/2021   Personal history of colonic polyps 05/14/2021   Retinal hole, right 04/23/2021   Bilateral impacted cerumen 06/19/2020   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   Posterior vitreous detachment of left eye 04/20/2020   Posterior vitreous detachment of right eye 04/12/2020   Other vitreous opacities, left eye 04/12/2020   Round hole of left eye 04/12/2020   Retinal break, 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   Lattice degeneration of retina, 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   Chorioretinal scar 04/12/2020   Pseudophakia, both eyes 04/12/2020   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   Unilateral primary osteoarthritis, 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   Cubital tunnel syndrome on right 03/18/2017   Left carpal tunnel syndrome 03/18/2017   Iron deficiency anemia 07/01/2016   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   Syncope 01/14/2012    Class: Acute   Spinal stenosis of lumbar region 01/14/2012    Class: Chronic   Ankle pain, left 01/14/2012    Class: Chronic   Hyperlipidemia 01/14/2012    Class: Chronic   Hyperglycemia 01/14/2012    Class: Chronic   Hypokalemia 01/14/2012    Class: Acute   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   PCP:  Donnajean Lopes, MD Pharmacy:   [id number] [place]Phone: [phone]: [phone]    Social Determinants of Health (SDOH) Social History: SDOH Screenings   Food Insecurity: No Food Insecurity (07/31/2022)  Housing: Low Risk  (07/31/2022)  Transportation Needs: No Transportation Needs (07/31/2022)  Utilities: Not At Risk (07/31/2022)  Depression (PHQ2-9): Low Risk  (07/31/2022)  Tobacco Use: Medium Risk (07/31/2022)   SDOH Interventions:     Readmission Risk Interventions    08/01/2022    3:38 PM  Readmission Risk Prevention Plan  Post Dischage Appt Complete  Medication Screening Complete  Transportation Screening Complete
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
## 2022-08-02 DIAGNOSIS — T847XXD Infection and inflammatory reaction due to other internal orthopedic prosthetic devices, implants and grafts, subsequent encounter: Secondary | ICD-10-CM

## 2022-08-02 DIAGNOSIS — M869 Osteomyelitis, unspecified: Secondary | ICD-10-CM

## 2022-08-02 DIAGNOSIS — S81802A Unspecified open wound, left lower leg, initial encounter: Secondary | ICD-10-CM | POA: Diagnosis not present

## 2022-08-02 MED ORDER — CEFEPIME IV (FOR PTA / DISCHARGE USE ONLY): 2.0000 g | Freq: Three times a day (TID) | INTRAVENOUS | 0 refills | Status: AC

## 2022-08-02 MED ORDER — EPINEPHRINE 0.3 MG/0.3ML IJ SOAJ
0.3000 mg | Freq: Once | INTRAMUSCULAR | Status: DC | PRN
  Filled 2022-08-02: qty 0.6

## 2022-08-02 MED ORDER — DIPHENHYDRAMINE HCL 50 MG/ML IJ SOLN
25.0000 mg | Freq: Once | INTRAMUSCULAR | Status: DC | PRN
  Filled 2022-08-02: qty 1

## 2022-08-02 MED ORDER — SODIUM CHLORIDE 0.9 % IV SOLN
2.0000 g | Freq: Three times a day (TID) | INTRAVENOUS | Status: DC
  Administered 2022-08-02: 2 g via INTRAVENOUS
  Filled 2022-08-02: qty 12.5

## 2022-08-02 NOTE — TOC Transition Note (Addendum)
Transition of Care [facility]) - CM/SW Discharge Note   Patient Details  Name: Allen Taylor MRN: [id number] Date of Birth: 04/08/1946  Transition of Care [facility]) CM/SW Contact:  Curlene Labrum, RN Phone Number: 08/02/2022, 1:50 PM   Clinical Narrative:    CM spoke with Carolynn Sayers, CM with [employer] and she is waiting on [place] approval for patient's IV Home Antibiotics at this time.  The patient will remain in the hospital until the insurance authorization is obtained.  Charge RN is aware.  08/02/2022 1422 - CM called and spoke with Carolynn Sayers, RNCM with [employer] and the patient has been approved by [facility] for IV medication for home and medication will be delivered by [employer] to the home and chandler, RNCM with [employer] will follow up with the patient on home phone.   Final next level of care: Home w Home Health Services Barriers to Discharge: Continued Medical Work up   Patient Goals and CMS Choice CMS Medicare.gov Compare Post Acute Care list provided to:: Patient Choice offered to / list presented to : Patient  Discharge Placement                         Discharge Plan and Services Additional resources added to the After Visit Summary for     Discharge Planning Services: CM Consult Post Acute Care Choice: Home Health                    HH Arranged: RN, IV Antibiotics HH Agency: [employer], [place] Date [facility] Agency Contacted: 08/01/22 Time [place]: [place] Representative spoke with at [place]: Tommi Rumps, [place] with Elliot Gault, [place] with [employer] for IV antibiotics  Social Determinants of Health (SDOH) Interventions SDOH Screenings   Food Insecurity: No Food Insecurity (07/31/2022)  Housing: Low Risk  (07/31/2022)  Transportation Needs: No Transportation Needs (07/31/2022)  Utilities: Not At Risk (07/31/2022)  Depression (PHQ2-9): Low Risk  (07/31/2022)  Tobacco Use: Medium Risk (07/31/2022)     Readmission Risk Interventions    08/01/2022     3:38 PM  Readmission Risk Prevention Plan  Post Dischage Appt Complete  Medication Screening Complete  Transportation Screening Complete

## 2022-08-02 NOTE — Progress Notes (Signed)
Patient discharged to home, AVS reviewed with the patient and his wife, all questions answered. Patient discharged with PICC. This RN assisted patient to the exit, his wife provided transportation

## 2022-08-02 NOTE — Progress Notes (Signed)
Pharmacy Note- Penicillin Allergy Clarification   ASSESSMENT: [name] reported an allergy to an unspecified cephalosporin many years ago that he described as diffuse arthralgias and feet swelling I spoke with a Allen Taylor pharmacist who stated the allergy was specifically to Keflex. She stated the foot was being treated with cellulitis and was already swollen. There was not other anaphylactic features to the reaction including difficulty breathing. [name] does believe he required treatment for the reaction. His allergy to Keflex has been updated to reflect this additional information. As [name] needs protracted therapy for a Pseudomonas aeruginosa osteomyelitis we would like to assess his tolerability to Cefepime. After discussion of the risks and benefits, [name] and his wife have agreed to proceed with a direct IV challenge of cefepime.    PEN-FAST Scoring   Five years or less since last reaction 0  Anaphylaxis/Angioedema OR Severe cutaneous adverse reaction  0  Treatment required for reaction  +1  Total Score 1-2 points - Low risk of positive penicillin allergy test (5%)    Type of intervention (select all that apply):  Direct IV therapy  Impact on therapy (select all that apply):  Antibiotic de-escalation   PLAN: - Cefepime 2g IV  - Q47mn vitals monitoring x 1 hour  - Epi-Pen PRN  - IV diphenhydramine PRN  - plan communicated to primary RN   POST-CHALLEN[place] FOLLOW-UP: Per RN, [name] the Cefepime challenge quite well without reaction.   AAdria Taylor PharmD PGY-2 Infectious Diseases Resident  08/02/2022 1:09 PM

## 2022-08-02 NOTE — Progress Notes (Signed)
Subjective: No new complaints   Antibiotics:  Anti-infectives (From admission, onward)    Start     Dose/Rate Route Frequency Ordered Stop   08/02/22 1000  ceFEPIme (MAXIPIME) 2 g in sodium chloride 0.9 % 100 mL IVPB        2 g 200 mL/hr over 30 Minutes Intravenous Every 8 hours 08/02/22 0913     08/02/22 0000  ceFEPime (MAXIPIME) IVPB        2 g Intravenous Every 8 hours 08/02/22 1311 09/12/22 2359   07/31/22 2300  meropenem (MERREM) 1 g in sodium chloride 0.9 % 100 mL IVPB  Status:  Discontinued        1 g 200 mL/hr over 30 Minutes Intravenous Every 8 hours 07/31/22 2205 08/02/22 0913       Medications: Scheduled Meds:  Chlorhexidine Gluconate Cloth  6 each Topical Daily   colestipol  2 g Oral BID   enoxaparin (LOVENOX) injection  40 mg Subcutaneous Q24H   multivitamin with minerals  1 tablet Oral Daily   sodium chloride flush  10-40 mL Intracatheter Q12H   Continuous Infusions:  ceFEPime (MAXIPIME) IV 2 g (08/02/22 1052)   PRN Meds:.acetaminophen **OR** acetaminophen, ALPRAZolam, amitriptyline, diphenhydrAMINE, EPINEPHrine, HYDROcodone-acetaminophen, ondansetron **OR** ondansetron (ZOFRAN) IV, polyethylene glycol, sodium chloride flush    Objective: Weight change:   Intake/Output Summary (Last 24 hours) at 08/02/2022 1805 Last data filed at 08/02/2022 0608 Gross per 24 hour  Intake 1000 ml  Output --  Net 1000 ml   Blood pressure 111/62, pulse 75, temperature 98.1 F (36.7 C), temperature source Oral, resp. rate 18, height '5\' 6"'$  (1.676 m), weight 84.8 kg, SpO2 99 %. Temp:  [97.5 F (36.4 C)-98.4 F (36.9 C)] 98.1 F (36.7 C) (02/02 1213) Pulse Rate:  [59-94] 75 (02/02 1213) Resp:  [16-18] 18 (02/02 1213) BP: (106-142)/(60-85) 111/62 (02/02 1213) SpO2:  [95 %-100 %] 99 % (02/02 1213)  Physical Exam: Physical Exam Constitutional:      Appearance: He is well-developed.  HENT:     Head: Normocephalic and atraumatic.  Eyes:      Conjunctiva/sclera: Conjunctivae normal.  Cardiovascular:     Rate and Rhythm: Normal rate and regular rhythm.  Pulmonary:     Effort: Pulmonary effort is normal.  Abdominal:     General: There is no distension.     Palpations: Abdomen is soft.  Musculoskeletal:        General: Normal range of motion.     Cervical back: Normal range of motion and neck supple.  Skin:    General: Skin is warm and dry.     Findings: No erythema or rash.  Neurological:     General: No focal deficit present.     Mental Status: He is alert and oriented to person, place, and time.  Psychiatric:        Mood and Affect: Mood normal.        Behavior: Behavior normal.        Thought Content: Thought content normal.        Judgment: Judgment normal.     Left leg wrapped  CBC:    BMET Recent Labs    07/31/22 2124  NA 138  K 4.3  CL 105  CO2 22  GLUCOSE 102*  BUN 20  CREATININE 0.74  CALCIUM 9.0     Liver Panel  Recent Labs    07/31/22 2124  PROT 6.7  ALBUMIN 3.7  AST  26  ALT 16  ALKPHOS 68  BILITOT 0.4       Sedimentation Rate No results for input(s): "ESRSEDRATE" in the last 72 hours. C-Reactive Protein No results for input(s): "CRP" in the last 72 hours.  Micro Results: Recent Results (from the past 720 hour(s))  Aerobic/Anaerobic Culture w Gram Stain (surgical/deep wound)     Status: None   Collection Time: 07/25/22 11:47 AM   Specimen: PATH Other; Tissue  Result Value Ref Range Status   Specimen Description WOUND  Final   Special Requests LEFT LEG  Final   Gram Stain   Final    FEW WBC PRESENT,BOTH PMN AND MONONUCLEAR NO ORGANISMS SEEN    Culture   Final    RARE PSEUDOMONAS AERUGINOSA NO ANAEROBES ISOLATED Performed at [facility] [place] [facility], [address], [place], [place] [address]    Report Status 07/30/2022 FINAL  Final   Organism ID, Bacteria PSEUDOMONAS AERUGINOSA  Final      Susceptibility   Pseudomonas aeruginosa - MIC*    CEFTAZIDIME <=1 SENSITIVE  Sensitive     CIPROFLOXACIN 1 INTERMEDIATE Intermediate     GENTAMICIN <=1 SENSITIVE Sensitive     IMIPENEM 1 SENSITIVE Sensitive     PIP/TAZO <=4 SENSITIVE Sensitive     CEFEPIME 1 SENSITIVE Sensitive     * RARE PSEUDOMONAS AERUGINOSA    Studies/Results: [place] EKG SITE RITE  Result Date: 07/31/2022 If Site Rite image not attached, placement could not be confirmed due to current cardiac rhythm.     Assessment/Plan:  INTERVAL HISTORY: pt tolerated cefepime   Principal Problem:   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HCC) Active Problems:   Hyperlipidemia   Non-healing wound of left lower extremity    Allen Taylor is a 77 y.o. 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  #1 Hardware associated osteomyelitis:  [place] our infectious [facility] pharmacist checked with the [facility] patient's adverse reaction had occurred with cephalexin and was characterized by diffuse myalgias not by a rash or difficulty breathing.  We proceeded with giving him a test dose of cefepime which is a fourth generation cephalosporin.  He tolerated with this without difficulty and we decided to send him home with cefepime   OPAT Orders Discharge antibiotics to be given via PICC line Discharge antibiotics:   cefepime 2g IV Q8H    End Date:  09/12/22 (6w from 08/01/22)    PIC Care Per Protocol:   Home health RN for IV administration and teaching; PICC line care and labs.     Labs weekly while on IV antibiotics: _x_ CBC with differential _x_ BMP   x__ CRP _x_ ESR   __ Please pull PIC at completion of IV antibiotics _x_ Please leave PIC in place until doctor has seen patient or been notified   Fax weekly labs to [phone]   Clinic Follow Up Appt:     Allen Taylor has an appointment on 09/04/2022 at 345PM with Dr. Candiss Norse at   [facility] for Infectious Disease, which  is located in the [facility] at   [address] in [place].   [address], which is located to the left of the elevators.   Phone: [phone]   Fax: [phone]   [email or web address]   The patient should arrive 30 minutes prior to their appoitment.  I spent 52 minutes with the patient including than 50% of the time in face to face counseling of the patient during his hardware associated osteomyelitis his prior adverse reaction to cephalexin and desire to proceed with cefepime  along with review of medical records in preparation for the visit and during the visit and in coordination of his care.    LOS: 2 days   Alcide Evener 08/02/2022, 6:05 PM

## 2022-08-02 NOTE — Plan of Care (Signed)

## 2022-08-02 NOTE — Discharge Summary (Signed)
Physician Discharge Summary  DENARD TUMINELLO XBM:[id number] DOB: 1946/01/17 DOA: 07/31/2022  PCP: Donnajean Lopes, MD  Admit date: 07/31/2022 Discharge date: 08/02/2022  Admitted From: Home Discharge disposition: Home with home health, PICC line, IV antibiotics  Brief narrative: Allen Taylor is a 77 y.o. 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  Following the surgery, for decades, patient had intermittent drainage from the left leg ulcer associated with his injury.  He was off and on antibiotics for several years.  For the last few years, the drainage has been worsening and hence he decided to get surgical intervention. He was seen by orthopedics [name] who had discussed removing the hardware plate and A4 to cure deep infection.  He completed a 6-week course of oral ciprofloxacin with a plan of elective surgery for plate removal.   0/27, underwent removal of painful hardware of left fibula.  He was noted to have purulent drainage from the proximal fibula.  Wound cultures grew Pseudomonas resistant to ciprofloxacin.  He was seen by ID as an outpatient and plan for PICC line placement and IV antibiotics.  However, he could not get appointment from IR till 2/7 for PICC line placement and hence he was admitted on 1/31 with that plan. Will start on IV meropenem See below for details  Subjective: Patient was seen and examined this morning.   Walking around the room.  Wife at bedside.  Hospital course: Chronic left lower extremity wound  s/p hardware removal 1/25 Pseudomonas in wound culture resistant to ciprofloxacin -PICC line ordered as patient would like to continue antibiotics at home. -Started on meropenem.  ID recommended for 6 weeks course  Hyperlipidemia Continue colestipol  Anxiety/depression Continue PRN Elavil, PRN  Xanax   Mobility: Encourage ambulation  Goals of care   Code Status: Full Code   Wounds:  - Wound / Incision (Open or Dehisced) 07/31/22 Incision - Open Pretibial Proximal;Lower;Left (Active)  Date First Assessed/Time First Assessed: 07/31/22 2245   Wound Type: Incision - Open  Location: Pretibial  Location Orientation: Proximal;Lower;Left  Present on Admission: Yes    Assessments 07/31/2022 10:45 PM 08/01/2022  9:44 PM  Dressing Type Gauze (Comment);Other (Comment) Gauze (Comment);Compression wrap  Dressing Status Clean, Dry, Intact Clean, Dry, Intact  Site / Wound Assessment Dressing in place / Unable to assess Dressing in place / Unable to assess  Drainage Amount -- None     No associated orders.    Discharge Exam:   Vitals:   08/02/22 1100 08/02/22 1115 08/02/22 1145 08/02/22 1213  BP: 106/75 128/60 109/78 111/62  Pulse: 76 74 76 75  Resp: '18  18 18  '$ Temp: 97.9 F (36.6 C) 97.9 F (36.6 C) 98.1 F (36.7 C) 98.1 F (36.7 C)  TempSrc: Oral Oral Oral Oral  SpO2: 99% 95% 95% 99%  Weight:      Height:        Body mass index is 30.17 kg/m.  General exam: Pleasant elderly Caucasian male.  Not in distress Skin: No rashes, lesions or ulcers. HEENT: Atraumatic, normocephalic, no obvious bleeding Lungs: Clear to auscultation bilaterally CVS: Regular rate and rhythm, no murmur GI/Abd soft, nontender, nondistended, bowel sound present CNS: Alert, awake, oriented x 3 Psychiatry: Mood appropriate Extremities: No pedal edema, no calf tenderness  Follow ups:    Follow-up Information  [facility] Follow up.   Why: [facility] will be providing coordination and delivery of your IV antibiotics to your home.        [facility] Follow up.   Specialty: Home Health Services Why: Alvis Lemmings will be providing home health RN services.  They will call you to set up RN visits will likely be 1-2 times per week as needed and PICC line dressing changes weekly. Contact  information: [place] [address] [phone]         Donnajean Lopes, MD Follow up.   Specialty: Internal Medicine Contact information: [address] [place] [address] [phone]                 Discharge Instructions:   Discharge Instructions     [facility] pharmacist to adjust dose for Vancomycin, Aminoglycosides and other anti-infective therapies as requested by physician.   Complete by: As directed    [facility] to provide Cath Flo '2mg'$    Complete by: As directed    Administer for PICC line occlusion and as ordered by physician for other access device issues.   Anaphylaxis Kit: Provided to treat any anaphylactic reaction to the medication being provided to the patient if First Dose or when requested by physician   Complete by: As directed    Epinephrine '1mg'$ /ml vial / amp: Administer 0.'3mg'$  (0.65m) subcutaneously once for moderate to severe anaphylaxis, nurse to call physician and pharmacy when reaction occurs and call 911 if needed for immediate care   Diphenhydramine '50mg'$ /ml IV vial: Administer 25-'50mg'$  IV/IM PRN for first dose reaction, rash, itching, mild reaction, nurse to call physician and pharmacy when reaction occurs   Sodium Chloride 0.9% NS 5071mIV: Administer if needed for hypovolemic blood pressure drop or as ordered by physician after call to physician with anaphylactic reaction   Call MD for:  difficulty breathing, headache or visual disturbances   Complete by: As directed    Call MD for:  extreme fatigue   Complete by: As directed    Call MD for:  hives   Complete by: As directed    Call MD for:  persistant dizziness or light-headedness   Complete by: As directed    Call MD for:  persistant nausea and vomiting   Complete by: As directed    Call MD for:  severe uncontrolled pain   Complete by: As directed    Call MD for:  temperature >100.4   Complete by: As directed    Change dressing  on IV access line weekly and PRN   Complete by: As directed    Diet general   Complete by: As directed    Discharge instructions   Complete by: As directed    General discharge instructions: Follow with Primary MD PaDonnajean LopesMD in 7 days  Please request your PCP  to go over your hospital tests, procedures, radiology results at the follow up. Please get your medicines reviewed and adjusted.  Your PCP may decide to repeat certain labs or tests as needed. Do not drive, operate heavy machinery, perform activities at heights, swimming or participation in water activities or provide baby sitting services if your were admitted for syncope or siezures until you have seen by Primary MD or a Neurologist and advised to do so again. No[place] Sub[place] System database was reviewed. Do not drive, operate heavy machinery, perform activities at heights, swim, participate in water activities or provide  baby-sitting services while on medications for pain, sleep and mood until your outpatient physician has reevaluated you and advised to do so again.  You are strongly recommended to comply with the dose, frequency and duration of prescribed medications. Activity: As tolerated with Full fall precautions use walker/cane & assistance as needed Avoid using any recreational substances like cigarette, tobacco, alcohol, or non-prescribed drug. If you experience worsening of your admission symptoms, develop shortness of breath, life threatening emergency, suicidal or homicidal thoughts you must seek medical attention immediately by calling 911 or calling your MD immediately  if symptoms less severe. You must read complete instructions/literature along with all the possible adverse reactions/side effects for all the medicines you take and that have been prescribed to you. Take any new medicine only after you have completely understood and accepted all the possible adverse reactions/side effects.   Wear Seat belts while driving. You were cared for by a hospitalist during your hospital stay. If you have any questions about your discharge medications or the care you received while you were in the hospital after you are discharged, you can call the unit and ask to speak with the hospitalist or the covering physician. Once you are discharged, your primary care physician will handle any further medical issues. Please note that NO REFILLS for any discharge medications will be authorized once you are discharged, as it is imperative that you return to your primary care physician (or establish a relationship with a primary care physician if you do not have one).   Discharge wound care:   Complete by: As directed    Flush IV access with Sodium Chloride 0.9% and Heparin 10 units/ml or 100 units/ml   Complete by: As directed    Home infusion instructions - [facility]   Complete by: As directed    Instructions: Flush IV access with Sodium Chloride 0.9% and Heparin 10units/ml or 100units/ml   Change dressing on IV access line: Weekly and PRN   Instructions Cath Flo '2mg'$ : Administer for PICC Line occlusion and as ordered by physician for other access device   [facility] pharmacist to adjust dose for: Vancomycin, Aminoglycosides and other anti-infective therapies as requested by physician   Increase activity slowly   Complete by: As directed    Method of administration may be changed at the discretion of home infusion pharmacist based upon assessment of the patient and/or caregiver's ability to self-administer the medication ordered   Complete by: As directed        Discharge Medications:   Allergies as of 08/02/2022       Reactions   Keflex [cephalexin] Hives, Swelling   [facility]. Patient had reaction to Keflex in 2009 described as arthralgias    Lidoderm [lidocaine] Other (See Comments)   Irritation at site of lidocaine patch.   Pravachol [pravastatin]  Other (See Comments)   Myalgias    Sulfamethoxazole Other (See Comments)   Unknown reaction   Zocor [simvastatin] Other (See Comments)   Myalgias    Crestor [rosuvastatin] Other (See Comments)   Myalgias         Medication List     STOP taking these medications    ciprofloxacin 750 MG tablet Commonly known as: CIPRO       TAKE these medications    ALPRAZolam 1 MG tablet Commonly known as: XANAX Take 0.5 mg by mouth at bedtime as needed for sleep.   amitriptyline 100 MG tablet Commonly known as: ELAVIL Take 100  mg by mouth at bedtime as needed for sleep.   bacitracin-polymyxin b ointment Commonly known as: POLYSPORIN Apply 1 application  topically daily as needed (irritation, redness on right leg).   ceFEPime  IVPB Commonly known as: MAXIPIME Inject 2 g into the vein every 8 (eight) hours. Indication:  Pseudomonas aeruginosa osteomyelitis First Dose: Yes Last Day of Therapy:  09/12/22 Labs - Once weekly:  CBC/D and BMP, Labs - Every other week:  ESR and CRP Method of administration: IV Push Method of administration may be changed at the discretion of home infusion pharmacist based upon assessment of the patient and/or caregiver's ability to self-administer the medication ordered.   colestipol 1 g tablet Commonly known as: COLESTID Take 2 g by mouth 2 (two) times daily.   diclofenac 75 MG EC tablet Commonly known as: VOLTAREN Take 75 mg by mouth 2 (two) times daily.   docusate sodium 100 MG capsule Commonly known as: Colace Take 1 capsule (100 mg total) by mouth 2 (two) times daily. While taking narcotic pain medicine.   FeroSul 325 (65 FE) MG tablet Generic drug: ferrous sulfate Take 325 mg by mouth 2 (two) times daily with a meal.   Fish Oil 1000 MG Caps Take 1,000 mg by mouth 2 (two) times daily.   HYDROcodone-acetaminophen 5-325 MG tablet Commonly known as: NORCO/VICODIN Take 1 tablet by mouth every 4 (four) hours as needed for severe pain.    multivitamin tablet Take 1 tablet by mouth daily.   senna 8.6 MG Tabs tablet Commonly known as: SENOKOT Take 2 tablets (17.2 mg total) by mouth 2 (two) times daily.               Discharge Care Instructions  (From admission, onward)           Start     Ordered   08/02/22 0000  Change dressing on IV access line weekly and PRN  (Home infusion instructions - [facility] )        08/02/22 1311   08/02/22 0000  Discharge wound care:        08/02/22 1311             The results of significant diagnostics from this hospitalization (including imaging, microbiology, ancillary and laboratory) are listed below for reference.    Procedures and Diagnostic Studies:   [place] EKG SITE RITE  Result Date: 07/31/2022 If Site Rite image not attached, placement could not be confirmed due to current cardiac rhythm.    Labs:   Basic Metabolic Panel: Recent Labs  Lab 07/31/22 2124  NA 138  K 4.3  CL 105  CO2 22  GLUCOSE 102*  BUN 20  CREATININE 0.74  CALCIUM 9.0   GFR Estimated Creatinine Clearance: 77.7 mL/min (by C-G formula based on SCr of 0.74 mg/dL). Liver Function Tests: Recent Labs  Lab 07/31/22 2124  AST 26  ALT 16  ALKPHOS 68  BILITOT 0.4  PROT 6.7  ALBUMIN 3.7   No results for input(s): "LIPASE", "AMYLASE" in the last 168 hours. No results for input(s): "AMMONIA" in the last 168 hours. Coagulation profile No results for input(s): "INR", "PROTIME" in the last 168 hours.  CBC: Recent Labs  Lab 07/31/22 2124  WBC 12.5*  NEUTROABS 8.3*  HGB 15.5  HCT 45.8  MCV 84.3  PLT 487*   Cardiac Enzymes: No results for input(s): "CKTOTAL", "CKMB", "CKMBINDEX", "TROPONINI" in the last 168 hours. BNP: Invalid input(s): "POCBNP" CBG: No results for input(s): "GLUCAP"  in the last 168 hours. D-Dimer No results for input(s): "DDIMER" in the last 72 hours. Hgb A1c No results for input(s): "HGBA1C" in the last 72 hours. Lipid Profile No results  for input(s): "CHOL", "HDL", "LDLCALC", "TRIG", "CHOLHDL", "LDLDIRECT" in the last 72 hours. Thyroid function studies No results for input(s): "TSH", "T4TOTAL", "T3FREE", "THYROIDAB" in the last 72 hours.  Invalid input(s): "FREET3" Anemia work up No results for input(s): "VITAMINB12", "FOLATE", "FERRITIN", "TIBC", "IRON", "RETICCTPCT" in the last 72 hours. Microbiology Recent Results (from the past 240 hour(s))  Aerobic/Anaerobic Culture w Gram Stain (surgical/deep wound)     Status: None   Collection Time: 07/25/22 11:47 AM   Specimen: PATH Other; Tissue  Result Value Ref Range Status   Specimen Description WOUND  Final   Special Requests LEFT LEG  Final   Gram Stain   Final    FEW WBC PRESENT,BOTH PMN AND MONONUCLEAR NO ORGANISMS SEEN    Culture   Final    RARE PSEUDOMONAS AERUGINOSA NO ANAEROBES ISOLATED Performed at [facility], [address], [place], [place] [address]    Report Status 07/30/2022 FINAL  Final   Organism ID, Bacteria PSEUDOMONAS AERUGINOSA  Final      Susceptibility   Pseudomonas aeruginosa - MIC*    CEFTAZIDIME <=1 SENSITIVE Sensitive     CIPROFLOXACIN 1 INTERMEDIATE Intermediate     GENTAMICIN <=1 SENSITIVE Sensitive     IMIPENEM 1 SENSITIVE Sensitive     PIP/TAZO <=4 SENSITIVE Sensitive     CEFEPIME 1 SENSITIVE Sensitive     * RARE PSEUDOMONAS AERUGINOSA    Time coordinating discharge: 35 minutes  Signed: Dora Simeone  [facility] 08/02/2022, 1:12 PM

## 2022-08-02 NOTE — Progress Notes (Signed)
PHARMACY CONSULT NOTE FOR:  OUTPATIENT  PARENTERAL ANTIBIOTIC THERAPY (OPAT)  Indication: Pseudomonas aeruginosa osteomyelitis  Regimen: cefepime 2g IV Q8H  End date: 09/12/22 (6w from 08/01/22)  IV antibiotic discharge orders are pended. To discharging provider:  please sign these orders via discharge navigator,  Select New Orders & click on the button choice - Manage This Unsigned Work.     Thank you for allowing pharmacy to be a part of this patient's care.  Adria Dill, PharmD PGY-2 Infectious Diseases Resident  08/02/2022 9:21 AM

## 2022-08-03 DIAGNOSIS — D649 Anemia, unspecified: Secondary | ICD-10-CM | POA: Diagnosis not present

## 2022-08-03 DIAGNOSIS — M869 Osteomyelitis, unspecified: Secondary | ICD-10-CM | POA: Diagnosis not present

## 2022-08-03 DIAGNOSIS — Z452 Encounter for adjustment and management of vascular access device: Secondary | ICD-10-CM | POA: Diagnosis not present

## 2022-08-03 DIAGNOSIS — F419 Anxiety disorder, unspecified: Secondary | ICD-10-CM | POA: Diagnosis not present

## 2022-08-03 DIAGNOSIS — M48061 Spinal stenosis, lumbar region without neurogenic claudication: Secondary | ICD-10-CM | POA: Diagnosis not present

## 2022-08-03 DIAGNOSIS — M5030 Other cervical disc degeneration, unspecified cervical region: Secondary | ICD-10-CM | POA: Diagnosis not present

## 2022-08-03 DIAGNOSIS — T8469XA Infection and inflammatory reaction due to internal fixation device of other site, initial encounter: Secondary | ICD-10-CM | POA: Diagnosis not present

## 2022-08-03 DIAGNOSIS — G4700 Insomnia, unspecified: Secondary | ICD-10-CM | POA: Diagnosis not present

## 2022-08-03 DIAGNOSIS — Z792 Long term (current) use of antibiotics: Secondary | ICD-10-CM | POA: Diagnosis not present

## 2022-08-03 DIAGNOSIS — E785 Hyperlipidemia, unspecified: Secondary | ICD-10-CM | POA: Diagnosis not present

## 2022-08-03 DIAGNOSIS — F32A Depression, unspecified: Secondary | ICD-10-CM | POA: Diagnosis not present

## 2022-08-03 DIAGNOSIS — B965 Pseudomonas (aeruginosa) (mallei) (pseudomallei) as the cause of diseases classified elsewhere: Secondary | ICD-10-CM | POA: Diagnosis not present

## 2022-08-05 DIAGNOSIS — Z792 Long term (current) use of antibiotics: Secondary | ICD-10-CM | POA: Diagnosis not present

## 2022-08-05 DIAGNOSIS — Z5181 Encounter for therapeutic drug level monitoring: Secondary | ICD-10-CM | POA: Diagnosis not present

## 2022-08-07 ENCOUNTER — Other Ambulatory Visit: Payer: Self-pay | Admitting: Physician Assistant

## 2022-08-07 ENCOUNTER — Ambulatory Visit: Payer: Medicare Other | Admitting: Infectious Disease

## 2022-08-12 DIAGNOSIS — Z792 Long term (current) use of antibiotics: Secondary | ICD-10-CM | POA: Diagnosis not present

## 2022-08-12 DIAGNOSIS — Z5181 Encounter for therapeutic drug level monitoring: Secondary | ICD-10-CM | POA: Diagnosis not present

## 2022-08-12 DIAGNOSIS — H43811 Vitreous degeneration, right eye: Secondary | ICD-10-CM | POA: Diagnosis not present

## 2022-08-12 DIAGNOSIS — H33321 Round hole, right eye: Secondary | ICD-10-CM | POA: Diagnosis not present

## 2022-08-12 DIAGNOSIS — H33312 Horseshoe tear of retina without detachment, left eye: Secondary | ICD-10-CM | POA: Diagnosis not present

## 2022-08-12 DIAGNOSIS — H33322 Round hole, left eye: Secondary | ICD-10-CM | POA: Diagnosis not present

## 2022-08-19 DIAGNOSIS — Z792 Long term (current) use of antibiotics: Secondary | ICD-10-CM | POA: Diagnosis not present

## 2022-08-19 DIAGNOSIS — Z5181 Encounter for therapeutic drug level monitoring: Secondary | ICD-10-CM | POA: Diagnosis not present

## 2022-08-20 DIAGNOSIS — F419 Anxiety disorder, unspecified: Secondary | ICD-10-CM | POA: Diagnosis not present

## 2022-08-20 DIAGNOSIS — A498 Other bacterial infections of unspecified site: Secondary | ICD-10-CM | POA: Diagnosis not present

## 2022-08-20 DIAGNOSIS — D509 Iron deficiency anemia, unspecified: Secondary | ICD-10-CM | POA: Diagnosis not present

## 2022-08-20 DIAGNOSIS — R000 Tachycardia, unspecified: Secondary | ICD-10-CM | POA: Diagnosis not present

## 2022-08-26 DIAGNOSIS — Z5181 Encounter for therapeutic drug level monitoring: Secondary | ICD-10-CM | POA: Diagnosis not present

## 2022-08-26 DIAGNOSIS — Z792 Long term (current) use of antibiotics: Secondary | ICD-10-CM | POA: Diagnosis not present

## 2022-09-02 DIAGNOSIS — M869 Osteomyelitis, unspecified: Secondary | ICD-10-CM | POA: Diagnosis not present

## 2022-09-02 DIAGNOSIS — Z5181 Encounter for therapeutic drug level monitoring: Secondary | ICD-10-CM | POA: Diagnosis not present

## 2022-09-02 DIAGNOSIS — T8469XA Infection and inflammatory reaction due to internal fixation device of other site, initial encounter: Secondary | ICD-10-CM | POA: Diagnosis not present

## 2022-09-02 DIAGNOSIS — B965 Pseudomonas (aeruginosa) (mallei) (pseudomallei) as the cause of diseases classified elsewhere: Secondary | ICD-10-CM | POA: Diagnosis not present

## 2022-09-02 DIAGNOSIS — Z792 Long term (current) use of antibiotics: Secondary | ICD-10-CM | POA: Diagnosis not present

## 2022-09-04 ENCOUNTER — Encounter: Payer: Self-pay | Admitting: Internal Medicine

## 2022-09-04 ENCOUNTER — Ambulatory Visit (INDEPENDENT_AMBULATORY_CARE_PROVIDER_SITE_OTHER): Payer: Medicare Other | Admitting: Internal Medicine

## 2022-09-04 ENCOUNTER — Other Ambulatory Visit: Payer: Self-pay

## 2022-09-04 ENCOUNTER — Telehealth: Payer: Self-pay

## 2022-09-04 VITALS — BP 128/86 | HR 72 | Temp 98.0°F | Wt 189.0 lb

## 2022-09-04 DIAGNOSIS — T847XXD Infection and inflammatory reaction due to other internal orthopedic prosthetic devices, implants and grafts, subsequent encounter: Secondary | ICD-10-CM | POA: Diagnosis not present

## 2022-09-04 NOTE — Telephone Encounter (Signed)
Called [facility] with verbal order to pull picc line on 3/14. Spoke with Cassie who was able to take verbal order.  Order repeated and confirmed. Leatrice Jewels, RMA
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
## 2022-09-09 DIAGNOSIS — B965 Pseudomonas (aeruginosa) (mallei) (pseudomallei) as the cause of diseases classified elsewhere: Secondary | ICD-10-CM | POA: Diagnosis not present

## 2022-09-09 DIAGNOSIS — T8469XA Infection and inflammatory reaction due to internal fixation device of other site, initial encounter: Secondary | ICD-10-CM | POA: Diagnosis not present

## 2022-09-09 DIAGNOSIS — M869 Osteomyelitis, unspecified: Secondary | ICD-10-CM | POA: Diagnosis not present

## 2022-09-09 DIAGNOSIS — Z792 Long term (current) use of antibiotics: Secondary | ICD-10-CM | POA: Diagnosis not present

## 2022-09-09 DIAGNOSIS — Z5181 Encounter for therapeutic drug level monitoring: Secondary | ICD-10-CM | POA: Diagnosis not present

## 2022-09-24 DIAGNOSIS — G894 Chronic pain syndrome: Secondary | ICD-10-CM | POA: Diagnosis not present

## 2022-09-24 DIAGNOSIS — M25572 Pain in left ankle and joints of left foot: Secondary | ICD-10-CM | POA: Diagnosis not present

## 2022-09-24 DIAGNOSIS — Z79891 Long term (current) use of opiate analgesic: Secondary | ICD-10-CM | POA: Diagnosis not present

## 2022-09-24 DIAGNOSIS — G546 Phantom limb syndrome with pain: Secondary | ICD-10-CM | POA: Diagnosis not present

## 2022-10-21 DIAGNOSIS — H35412 Lattice degeneration of retina, left eye: Secondary | ICD-10-CM | POA: Diagnosis not present

## 2022-10-21 DIAGNOSIS — H43811 Vitreous degeneration, right eye: Secondary | ICD-10-CM | POA: Diagnosis not present

## 2022-10-21 DIAGNOSIS — H43392 Other vitreous opacities, left eye: Secondary | ICD-10-CM | POA: Diagnosis not present

## 2022-10-21 DIAGNOSIS — H33323 Round hole, bilateral: Secondary | ICD-10-CM | POA: Diagnosis not present

## 2022-10-21 DIAGNOSIS — H33312 Horseshoe tear of retina without detachment, left eye: Secondary | ICD-10-CM | POA: Diagnosis not present

## 2022-10-24 DIAGNOSIS — G546 Phantom limb syndrome with pain: Secondary | ICD-10-CM | POA: Diagnosis not present

## 2022-10-24 DIAGNOSIS — M25572 Pain in left ankle and joints of left foot: Secondary | ICD-10-CM | POA: Diagnosis not present

## 2022-10-24 DIAGNOSIS — G894 Chronic pain syndrome: Secondary | ICD-10-CM | POA: Diagnosis not present

## 2022-10-24 DIAGNOSIS — Z79891 Long term (current) use of opiate analgesic: Secondary | ICD-10-CM | POA: Diagnosis not present

## 2022-11-21 DIAGNOSIS — G8929 Other chronic pain: Secondary | ICD-10-CM | POA: Diagnosis not present

## 2022-11-21 DIAGNOSIS — D649 Anemia, unspecified: Secondary | ICD-10-CM | POA: Diagnosis not present

## 2022-11-21 DIAGNOSIS — R002 Palpitations: Secondary | ICD-10-CM | POA: Diagnosis not present

## 2022-11-21 DIAGNOSIS — G546 Phantom limb syndrome with pain: Secondary | ICD-10-CM | POA: Diagnosis not present

## 2022-11-21 DIAGNOSIS — I10 Essential (primary) hypertension: Secondary | ICD-10-CM | POA: Diagnosis not present

## 2022-11-21 DIAGNOSIS — R2681 Unsteadiness on feet: Secondary | ICD-10-CM | POA: Diagnosis not present

## 2022-11-21 DIAGNOSIS — M25572 Pain in left ankle and joints of left foot: Secondary | ICD-10-CM | POA: Diagnosis not present

## 2022-11-21 DIAGNOSIS — Z79891 Long term (current) use of opiate analgesic: Secondary | ICD-10-CM | POA: Diagnosis not present

## 2022-11-21 DIAGNOSIS — M545 Low back pain, unspecified: Secondary | ICD-10-CM | POA: Diagnosis not present

## 2022-11-21 DIAGNOSIS — R739 Hyperglycemia, unspecified: Secondary | ICD-10-CM | POA: Diagnosis not present

## 2022-11-21 DIAGNOSIS — G894 Chronic pain syndrome: Secondary | ICD-10-CM | POA: Diagnosis not present

## 2022-11-21 DIAGNOSIS — G4700 Insomnia, unspecified: Secondary | ICD-10-CM | POA: Diagnosis not present

## 2022-12-04 DIAGNOSIS — G4710 Hypersomnia, unspecified: Secondary | ICD-10-CM | POA: Diagnosis not present

## 2023-01-06 ENCOUNTER — Encounter: Payer: Self-pay | Admitting: *Deleted

## 2023-01-07 ENCOUNTER — Encounter: Payer: Self-pay | Admitting: Neurology

## 2023-01-07 ENCOUNTER — Ambulatory Visit (INDEPENDENT_AMBULATORY_CARE_PROVIDER_SITE_OTHER): Payer: Medicare Other | Admitting: Neurology

## 2023-01-07 VITALS — BP 144/97 | HR 83 | Ht 64.0 in | Wt 185.0 lb

## 2023-01-07 DIAGNOSIS — R351 Nocturia: Secondary | ICD-10-CM | POA: Diagnosis not present

## 2023-01-07 DIAGNOSIS — R0683 Snoring: Secondary | ICD-10-CM | POA: Diagnosis not present

## 2023-01-07 DIAGNOSIS — Z820 Family history of epilepsy and other diseases of the nervous system: Secondary | ICD-10-CM

## 2023-01-07 DIAGNOSIS — G4719 Other hypersomnia: Secondary | ICD-10-CM

## 2023-01-07 DIAGNOSIS — Z9189 Other specified personal risk factors, not elsewhere classified: Secondary | ICD-10-CM

## 2023-01-07 DIAGNOSIS — E669 Obesity, unspecified: Secondary | ICD-10-CM

## 2023-01-07 DIAGNOSIS — R0681 Apnea, not elsewhere classified: Secondary | ICD-10-CM | POA: Diagnosis not present

## 2023-01-07 DIAGNOSIS — I499 Cardiac arrhythmia, unspecified: Secondary | ICD-10-CM

## 2023-01-07 DIAGNOSIS — E66811 Obesity, class 1: Secondary | ICD-10-CM

## 2023-01-07 NOTE — Patient Instructions (Signed)
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
## 2023-01-16 ENCOUNTER — Telehealth: Payer: Self-pay | Admitting: Neurology

## 2023-01-16 NOTE — Telephone Encounter (Signed)
Medicare no auth req/Tricare pending faxed notes

## 2023-01-20 DIAGNOSIS — Z79891 Long term (current) use of opiate analgesic: Secondary | ICD-10-CM | POA: Diagnosis not present

## 2023-01-20 DIAGNOSIS — G546 Phantom limb syndrome with pain: Secondary | ICD-10-CM | POA: Diagnosis not present

## 2023-01-20 DIAGNOSIS — G894 Chronic pain syndrome: Secondary | ICD-10-CM | POA: Diagnosis not present

## 2023-01-20 DIAGNOSIS — M25572 Pain in left ankle and joints of left foot: Secondary | ICD-10-CM | POA: Diagnosis not present

## 2023-01-29 NOTE — Telephone Encounter (Signed)
NPSG- Medicare/tricare no Berkley Harvey req

## 2023-02-19 ENCOUNTER — Other Ambulatory Visit (INDEPENDENT_AMBULATORY_CARE_PROVIDER_SITE_OTHER): Payer: Medicare Other

## 2023-02-19 ENCOUNTER — Ambulatory Visit (INDEPENDENT_AMBULATORY_CARE_PROVIDER_SITE_OTHER): Payer: Medicare Other | Admitting: Orthopaedic Surgery

## 2023-02-19 DIAGNOSIS — M79605 Pain in left leg: Secondary | ICD-10-CM

## 2023-02-19 DIAGNOSIS — M7062 Trochanteric bursitis, left hip: Secondary | ICD-10-CM

## 2023-02-19 MED ORDER — LIDOCAINE HCL 1 % IJ SOLN
3.0000 mL | INTRAMUSCULAR | Status: AC | PRN
  Administered 2023-02-19: 3 mL

## 2023-02-19 MED ORDER — METHYLPREDNISOLONE ACETATE 40 MG/ML IJ SUSP
40.0000 mg | INTRAMUSCULAR | Status: AC | PRN
  Administered 2023-02-19: 40 mg via INTRA_ARTICULAR

## 2023-02-19 NOTE — Progress Notes (Signed)
The patient is an active 77 year old gentleman who comes in today with left hip pain and he points to the trochanteric area as a source of his pain.  He has a history of having a left total hip arthroplasty back in 2020 but he also has a right below-knee amputation for which she wears a prosthesis.  He has significant degenerative changes in the lumbar spine and a degenerative scoliosis.  Earlier this year he had to have a plate removed from his left leg by one of my colleagues in town due to infection.  All of this was sent off his gait and has contributed to him having some left hip pain.  He is not a diabetic.  He is not currently on any antibiotics anymore.  On clinical exam his left hip moves smoothly and fluidly.  There is only pain over the trochanteric area and the IT band.  He is started off in his gait with having a below-knee amputation prosthesis on his right side and his lumbar spine degenerative changes as well as what is going on with his left hip and his left leg.  AP pelvis and lateral left hip shows no acute findings.  His lumbar spine x-rays also show degenerative findings that have not changed from previous films.  There is degenerative scoliosis and several areas of old compression fractures.  I did talk to him about trying a steroid injection of his left hip trochanteric area and he agreed to this and tolerated it well.  I showed him stretching exercises to try twice daily and he can also try Voltaren gel.  He will slow down in terms of some of his activities and can get back to activities as comfort allows over the next week or so.  If things worsen he will let us know.  He knows to wait at least 3 to 4 months between injections.     Procedure Note  Patient: Allen Taylor             Date of Birth: [birth year]           MRN: [id number]             Visit Date: 02/19/2023  Procedures: Visit Diagnoses:  1. Pain in left leg   2. Trochanteric bursitis, left hip     Large  Joint Inj: L greater trochanter on 02/19/2023 2:56 PM Indications: pain and diagnostic evaluation Details: 22 G 1.5 in needle, lateral approach  Arthrogram: No  Medications: 3 mL lidocaine 1 %; 40 mg methylPREDNISolone acetate 40 MG/ML Outcome: tolerated well, no immediate complications Procedure, treatment alternatives, risks and benefits explained, specific risks discussed. Consent was given by the patient. Immediately prior to procedure a time out was called to verify the correct patient, procedure, equipment, support staff and site/side marked as required. Patient was prepped and draped in the usual sterile fashion.

## 2023-03-18 DIAGNOSIS — G894 Chronic pain syndrome: Secondary | ICD-10-CM | POA: Diagnosis not present

## 2023-03-18 DIAGNOSIS — Z79891 Long term (current) use of opiate analgesic: Secondary | ICD-10-CM | POA: Diagnosis not present

## 2023-03-18 DIAGNOSIS — M25572 Pain in left ankle and joints of left foot: Secondary | ICD-10-CM | POA: Diagnosis not present

## 2023-03-18 DIAGNOSIS — G546 Phantom limb syndrome with pain: Secondary | ICD-10-CM | POA: Diagnosis not present

## 2023-04-21 DIAGNOSIS — H35412 Lattice degeneration of retina, left eye: Secondary | ICD-10-CM | POA: Diagnosis not present

## 2023-04-21 DIAGNOSIS — H33312 Horseshoe tear of retina without detachment, left eye: Secondary | ICD-10-CM | POA: Diagnosis not present

## 2023-04-21 DIAGNOSIS — H43811 Vitreous degeneration, right eye: Secondary | ICD-10-CM | POA: Diagnosis not present

## 2023-04-21 DIAGNOSIS — H33302 Unspecified retinal break, left eye: Secondary | ICD-10-CM | POA: Diagnosis not present

## 2023-04-21 DIAGNOSIS — H33323 Round hole, bilateral: Secondary | ICD-10-CM | POA: Diagnosis not present

## 2023-04-21 DIAGNOSIS — H43392 Other vitreous opacities, left eye: Secondary | ICD-10-CM | POA: Diagnosis not present

## 2023-04-22 NOTE — Telephone Encounter (Signed)
I spoke with patient wife Tyler Aas and she informed me that he needs to cancel his appointment for tonight because he has been up all night being sick. She stated he will call back to reschedule.

## 2023-04-29 ENCOUNTER — Telehealth: Payer: Self-pay | Admitting: Neurology

## 2023-04-29 NOTE — Telephone Encounter (Signed)
Reached out to the patient to r/s his SS.

## 2023-05-13 DIAGNOSIS — G894 Chronic pain syndrome: Secondary | ICD-10-CM | POA: Diagnosis not present

## 2023-05-13 DIAGNOSIS — M25572 Pain in left ankle and joints of left foot: Secondary | ICD-10-CM | POA: Diagnosis not present

## 2023-05-13 DIAGNOSIS — G546 Phantom limb syndrome with pain: Secondary | ICD-10-CM | POA: Diagnosis not present

## 2023-05-13 DIAGNOSIS — Z79891 Long term (current) use of opiate analgesic: Secondary | ICD-10-CM | POA: Diagnosis not present

## 2023-05-26 ENCOUNTER — Encounter: Payer: Self-pay | Admitting: Orthopaedic Surgery

## 2023-05-27 ENCOUNTER — Telehealth: Payer: Self-pay | Admitting: Orthopaedic Surgery

## 2023-05-27 NOTE — Telephone Encounter (Signed)
Patient called and wants to know if you could fit him in because he is hurting so bad that he can barely walk. CB#[phone]

## 2023-05-27 NOTE — Telephone Encounter (Signed)
Scheduled patient

## 2023-05-27 NOTE — Telephone Encounter (Signed)
Spoke with patient. Scheduled for Mon. 12/5.

## 2023-06-02 ENCOUNTER — Encounter: Payer: Self-pay | Admitting: Physician Assistant

## 2023-06-02 ENCOUNTER — Ambulatory Visit (INDEPENDENT_AMBULATORY_CARE_PROVIDER_SITE_OTHER): Payer: Medicare Other | Admitting: Physician Assistant

## 2023-06-02 DIAGNOSIS — M7062 Trochanteric bursitis, left hip: Secondary | ICD-10-CM

## 2023-06-02 MED ORDER — LIDOCAINE HCL 1 % IJ SOLN
3.0000 mL | INTRAMUSCULAR | Status: AC | PRN
  Administered 2023-06-02: 3 mL

## 2023-06-02 MED ORDER — METHYLPREDNISOLONE ACETATE 40 MG/ML IJ SUSP
40.0000 mg | INTRAMUSCULAR | Status: AC | PRN
  Administered 2023-06-02: 40 mg via INTRA_ARTICULAR

## 2023-06-02 NOTE — Progress Notes (Signed)
   Procedure Note  Patient: Allen Taylor             Date of Birth: 07-Dec-1945           MRN: [id number]             Visit Date: 06/02/2023   HPI: Allen Taylor comes in for left hip pain.  He was last seen August 21 of this year and was given a trochanteric injection.  States the injection gave him good relief until mid October.  He has had no new injury.  He is having pain lateral aspect of the hip similar to the pain he was having before.  He denies any numbness tingling down the leg.  Review of systems: Denies any fevers or chills  Physical exam: General Well-developed well-nourished male no acute distress able to get on and off the exam table on his own. Bilateral hips: Good range of motion of both hips without pain.  Tenderness over the left hip trochanteric region laterally.  Procedures: Visit Diagnoses:  1. Trochanteric bursitis, left hip     Large Joint Inj: L greater trochanter on 06/02/2023 1:24 PM Indications: pain Details: 22 G 1.5 in needle, lateral approach  Arthrogram: No  Medications: 3 mL lidocaine 1 %; 40 mg methylPREDNISolone acetate 40 MG/ML Outcome: tolerated well, no immediate complications Procedure, treatment alternatives, risks and benefits explained, specific risks discussed. Consent was given by the patient. Immediately prior to procedure a time out was called to verify the correct patient, procedure, equipment, support staff and site/side marked as required. Patient was prepped and draped in the usual sterile fashion.      Plan: He will work on IT band stretching.  Follow-up with [place] as needed.  Questions encouraged and answered at length.

## 2023-06-05 DIAGNOSIS — R739 Hyperglycemia, unspecified: Secondary | ICD-10-CM | POA: Diagnosis not present

## 2023-06-05 DIAGNOSIS — R7989 Other specified abnormal findings of blood chemistry: Secondary | ICD-10-CM | POA: Diagnosis not present

## 2023-06-05 DIAGNOSIS — D649 Anemia, unspecified: Secondary | ICD-10-CM | POA: Diagnosis not present

## 2023-06-05 DIAGNOSIS — R5382 Chronic fatigue, unspecified: Secondary | ICD-10-CM | POA: Diagnosis not present

## 2023-06-05 DIAGNOSIS — R002 Palpitations: Secondary | ICD-10-CM | POA: Diagnosis not present

## 2023-06-05 DIAGNOSIS — E785 Hyperlipidemia, unspecified: Secondary | ICD-10-CM | POA: Diagnosis not present

## 2023-06-05 DIAGNOSIS — Z1212 Encounter for screening for malignant neoplasm of rectum: Secondary | ICD-10-CM | POA: Diagnosis not present

## 2023-06-05 DIAGNOSIS — Z125 Encounter for screening for malignant neoplasm of prostate: Secondary | ICD-10-CM | POA: Diagnosis not present

## 2023-06-05 DIAGNOSIS — D509 Iron deficiency anemia, unspecified: Secondary | ICD-10-CM | POA: Diagnosis not present

## 2023-06-11 DIAGNOSIS — E785 Hyperlipidemia, unspecified: Secondary | ICD-10-CM | POA: Diagnosis not present

## 2023-06-11 DIAGNOSIS — Z1212 Encounter for screening for malignant neoplasm of rectum: Secondary | ICD-10-CM | POA: Diagnosis not present

## 2023-06-11 DIAGNOSIS — D649 Anemia, unspecified: Secondary | ICD-10-CM | POA: Diagnosis not present

## 2023-06-12 DIAGNOSIS — R5382 Chronic fatigue, unspecified: Secondary | ICD-10-CM | POA: Diagnosis not present

## 2023-06-12 DIAGNOSIS — Z1289 Encounter for screening for malignant neoplasm of other sites: Secondary | ICD-10-CM | POA: Diagnosis not present

## 2023-06-12 DIAGNOSIS — G4739 Other sleep apnea: Secondary | ICD-10-CM | POA: Diagnosis not present

## 2023-06-12 DIAGNOSIS — I10 Essential (primary) hypertension: Secondary | ICD-10-CM | POA: Diagnosis not present

## 2023-06-12 DIAGNOSIS — Z0000 Encounter for general adult medical examination without abnormal findings: Secondary | ICD-10-CM | POA: Diagnosis not present

## 2023-06-12 DIAGNOSIS — Z23 Encounter for immunization: Secondary | ICD-10-CM | POA: Diagnosis not present

## 2023-06-12 DIAGNOSIS — R2681 Unsteadiness on feet: Secondary | ICD-10-CM | POA: Diagnosis not present

## 2023-06-16 ENCOUNTER — Ambulatory Visit (INDEPENDENT_AMBULATORY_CARE_PROVIDER_SITE_OTHER): Payer: Medicare Other | Admitting: Neurology

## 2023-06-16 DIAGNOSIS — E66811 Obesity, class 1: Secondary | ICD-10-CM

## 2023-06-16 DIAGNOSIS — I499 Cardiac arrhythmia, unspecified: Secondary | ICD-10-CM

## 2023-06-16 DIAGNOSIS — G4720 Circadian rhythm sleep disorder, unspecified type: Secondary | ICD-10-CM

## 2023-06-16 DIAGNOSIS — G4734 Idiopathic sleep related nonobstructive alveolar hypoventilation: Secondary | ICD-10-CM

## 2023-06-16 DIAGNOSIS — R351 Nocturia: Secondary | ICD-10-CM

## 2023-06-16 DIAGNOSIS — Z9189 Other specified personal risk factors, not elsewhere classified: Secondary | ICD-10-CM

## 2023-06-16 DIAGNOSIS — G4733 Obstructive sleep apnea (adult) (pediatric): Secondary | ICD-10-CM

## 2023-06-16 DIAGNOSIS — R0683 Snoring: Secondary | ICD-10-CM | POA: Diagnosis not present

## 2023-06-16 DIAGNOSIS — R0681 Apnea, not elsewhere classified: Secondary | ICD-10-CM

## 2023-06-16 DIAGNOSIS — Z820 Family history of epilepsy and other diseases of the nervous system: Secondary | ICD-10-CM

## 2023-06-16 DIAGNOSIS — G4719 Other hypersomnia: Secondary | ICD-10-CM

## 2023-06-16 DIAGNOSIS — R9431 Abnormal electrocardiogram [ECG] [EKG]: Secondary | ICD-10-CM

## 2023-06-16 DIAGNOSIS — G4731 Primary central sleep apnea: Secondary | ICD-10-CM

## 2023-06-17 NOTE — Procedures (Unsigned)
Physician Interpretation:    Referred by: Allen Fillers, MD ***   History and Indication for Testing: 77 year old male with an underlying complex medical history of degenerative cervical disc disease, lumbar spinal stenosis, hyperlipidemia, seizures (isolated, secondary to tramadol, per chart review), gastric erosions, diverticulosis, skin cancer, arthritis, anxiety, right below-knee amputation, chronic pain, on chronic narcotic pain medication, and overweight state, who reports snoring and sleep disruption, restless sleep, also pain at night. His Epworth sleepiness score is 1 out of 24, fatigue severity score is 16 out of 63.   TITRATION DETAILS (SEE ALSO TABLE AT THE END OF THE REPORT):  The patient has severe obstructive sleep apnea and he qualified for an emergency split study.  The patient was shown several different interfaces and was subsequently fitted with a *** mask from ***.  The patient was started on a pressure of 5 cm of water pressure and gradually titrated to a final titration pressure of ***.  The AHI was improved and optimized at a pressure of *** cm, on which the patient achieved a total sleep time of  ***minutes.  Residual AHI was ***, O2 nadir of ***, with ***sleep achieved.    EEG: Review of the EEG showed no abnormal electrical discharges and symmetrical bihemispheric findings.     EKG: The EKG revealed normal sinus rhythm (NSR). ***   AUDIO/VIDEO REVIEW: The audio and video review did not show any abnormal or unusual behaviors, movements, phonations or vocalizations. The patient took *** restroom breaks. Snoring was noted, ***   POST-STUDY QUESTIONNAIRE: Post study, the patient indicated, that sleep was *** the same as usual.    IMPRESSION:   Severe Obstructive Sleep Apnea (OSA) ***Central Sleep Apnea (CSA) ***Primary Snoring ***Primary Central Sleep Apnea ***Complex Sleep Apnea ***PLMD (periodic limb movement disorder [of sleep]) ***Dysfunctions  associated with sleep stages or arousal from sleep ***Non-specific abnormal electrocardiogram (EKG) ***Poor sleep pattern ***Inconclusive Test   RECOMMENDATIONS:     This patient has severe obstructive sleep apnea. The patient qualified for an emergency split sleep study per AASM standards. The baseline AHI was ***/h, and O2 nadir ***%.  The patient responded well to PAP therapy. CPAP of *** cm resulted in significant reduction of *** sleep disordered breathing.  I, therefore, recommend home CPAP therapy at a pressure of ***13 cm via *** mask with heated humidity (or mask of choice, sized to fit, EPR as per tolerance). The patient will be advised to be fully compliant with PAP therapy to improve sleep related symptoms and decrease long term cardiovascular risks. Please note, that untreated obstructive sleep apnea may carry additional perioperative morbidity. Patients with significant obstructive sleep apnea should receive perioperative PAP therapy and the surgeons and particularly the anesthesiologist should be informed of the diagnosis and the severity of the sleep disordered breathing. This study shows sleep fragmentation and abnormal sleep stage percentages; these are nonspecific findings and per se do not signify an intrinsic sleep disorder or a cause for the patient's sleep-related symptoms. Causes include (but are not limited to) the first night effect of the sleep study, circadian rhythm disturbances, medication effect or an underlying mood disorder or medical problem.  The patient should be cautioned not to drive, work at heights, or operate dangerous or heavy equipment when tired or sleepy. Review and reiteration of good sleep hygiene measures should be pursued with any patient. The patient will be seen in follow-up in the sleep clinic at [facility] for discussion of the test results, symptom and treatment  compliance review, further management strategies, etc. The referring provider will be notified  of the test results.   I certify that I have reviewed the entire raw data recording prior to the issuance of this report in accordance with the Standards of Accreditation of the American Academy of Sleep Medicine (AASM).   [name], MD, PhD Medical Director, [facility]) Diplomat, ABPN (Neurology and Sleep)   Technical Report:   ***  Titration Table:  ***

## 2023-06-18 NOTE — Addendum Note (Signed)
Addended by: [name] on: 06/18/2023 06:12 PM   Modules accepted: Orders

## 2023-06-19 ENCOUNTER — Telehealth: Payer: Self-pay | Admitting: *Deleted

## 2023-06-19 NOTE — Telephone Encounter (Signed)
Zott, Hennie Duos, RN; Melvern Sample Got It Thank  you       Previous Messages    ----- Message ----- From: Guy Begin, RN Sent: 06/19/2023   3:10 PM EST To: Marlou Porch Zott Subject: URGENT BIPAP new patient                      Good afternoon,  New pt BIPAP URGENT set up for severe OSA  Dannielle Karvonen "Homero Fellers" Male, 77 y.o., April 17, 1946 MRN: [id number] Phone: [phone]   Thanks  Andrey Campanile

## 2023-06-19 NOTE — Telephone Encounter (Signed)
I relayed sleep results to pts wife per listed below. Urgent set up requested.  Insurance [facility], will send to [facility], sent community message  for urgent set up.  Pt to use every night 4 hours or more.  DME to authorize thru insurance to call you then to move forward with machine.  Made appt 09-16-2023 at 0945. Initial bipap appt.

## 2023-06-19 NOTE — Telephone Encounter (Signed)
-----   Message from Allen Taylor sent at 06/18/2023  6:12 PM EST ----- Urgent set up requested on PAP therapy, due to severe sleep apnea.  Patient had a split-night sleep study on 06/16/2023.  I saw him on 01/07/2023 at the request of his PCP.  Please advise patient that he has severe sleep apnea, he would likely need a more complex machine rather than just a simple CPAP machine.  He has a more complex type of sleep apnea, probably in part related to taking several sedating medications including narcotic pain medication, amitriptyline, and Xanax.  I recommend that he talk to his providers who prescribed his medications about the severity of his sleep apnea and that it may be related in part to his medications.  I would like to get him started on home BiPAP therapy, I wrote a machine order for him.  Ultimately, he may benefit from coming back in for a separate sleep study for treatment.  Please advise him about the compliance mandate from insurance and help him set up a follow-up appointment within 60 to 90 days of treatment start.  I would like to get him urgently on a BiPAP machine through a DME company.  His EKG showed frequent extra beats which we call PVCs.  I recommend that he follow-up with his primary care physician for a formal EKG in their office and the possibility of a referral to cardiology if PCP recommends it.

## 2023-07-03 ENCOUNTER — Ambulatory Visit: Payer: Medicare Other | Attending: Internal Medicine | Admitting: Physical Therapy

## 2023-07-03 ENCOUNTER — Other Ambulatory Visit: Payer: Self-pay

## 2023-07-03 ENCOUNTER — Encounter: Payer: Self-pay | Admitting: Physical Therapy

## 2023-07-03 DIAGNOSIS — R293 Abnormal posture: Secondary | ICD-10-CM | POA: Insufficient documentation

## 2023-07-03 DIAGNOSIS — M25611 Stiffness of right shoulder, not elsewhere classified: Secondary | ICD-10-CM | POA: Insufficient documentation

## 2023-07-03 DIAGNOSIS — R2681 Unsteadiness on feet: Secondary | ICD-10-CM | POA: Diagnosis not present

## 2023-07-03 DIAGNOSIS — R2689 Other abnormalities of gait and mobility: Secondary | ICD-10-CM | POA: Diagnosis not present

## 2023-07-03 DIAGNOSIS — M6281 Muscle weakness (generalized): Secondary | ICD-10-CM | POA: Diagnosis not present
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
## 2023-07-09 ENCOUNTER — Encounter: Payer: Self-pay | Admitting: Physical Therapy

## 2023-07-09 ENCOUNTER — Ambulatory Visit: Payer: Medicare Other | Admitting: Physical Therapy

## 2023-07-09 DIAGNOSIS — M6281 Muscle weakness (generalized): Secondary | ICD-10-CM

## 2023-07-09 DIAGNOSIS — R293 Abnormal posture: Secondary | ICD-10-CM | POA: Diagnosis not present

## 2023-07-09 DIAGNOSIS — R2689 Other abnormalities of gait and mobility: Secondary | ICD-10-CM | POA: Diagnosis not present

## 2023-07-09 DIAGNOSIS — M25611 Stiffness of right shoulder, not elsewhere classified: Secondary | ICD-10-CM | POA: Diagnosis not present

## 2023-07-09 DIAGNOSIS — R2681 Unsteadiness on feet: Secondary | ICD-10-CM | POA: Diagnosis not present
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
## 2023-07-15 ENCOUNTER — Ambulatory Visit: Payer: Medicare Other | Admitting: Physical Therapy

## 2023-07-15 ENCOUNTER — Encounter: Payer: Self-pay | Admitting: Physical Therapy

## 2023-07-15 DIAGNOSIS — M25611 Stiffness of right shoulder, not elsewhere classified: Secondary | ICD-10-CM | POA: Diagnosis not present

## 2023-07-15 DIAGNOSIS — R2681 Unsteadiness on feet: Secondary | ICD-10-CM | POA: Diagnosis not present

## 2023-07-15 DIAGNOSIS — R2689 Other abnormalities of gait and mobility: Secondary | ICD-10-CM | POA: Diagnosis not present

## 2023-07-15 DIAGNOSIS — M6281 Muscle weakness (generalized): Secondary | ICD-10-CM

## 2023-07-15 DIAGNOSIS — R293 Abnormal posture: Secondary | ICD-10-CM | POA: Diagnosis not present
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
## 2023-07-18 ENCOUNTER — Ambulatory Visit: Payer: Medicare Other | Admitting: Physical Therapy

## 2023-07-18 ENCOUNTER — Encounter: Payer: Self-pay | Admitting: Physical Therapy

## 2023-07-18 DIAGNOSIS — M6281 Muscle weakness (generalized): Secondary | ICD-10-CM | POA: Diagnosis not present

## 2023-07-18 DIAGNOSIS — R2689 Other abnormalities of gait and mobility: Secondary | ICD-10-CM

## 2023-07-18 DIAGNOSIS — R293 Abnormal posture: Secondary | ICD-10-CM | POA: Diagnosis not present

## 2023-07-18 DIAGNOSIS — M25611 Stiffness of right shoulder, not elsewhere classified: Secondary | ICD-10-CM | POA: Diagnosis not present

## 2023-07-18 DIAGNOSIS — R2681 Unsteadiness on feet: Secondary | ICD-10-CM | POA: Diagnosis not present
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
## 2023-07-21 ENCOUNTER — Encounter: Payer: Self-pay | Admitting: Physical Therapy

## 2023-07-21 ENCOUNTER — Ambulatory Visit: Payer: Medicare Other | Admitting: Physical Therapy

## 2023-07-21 DIAGNOSIS — R293 Abnormal posture: Secondary | ICD-10-CM

## 2023-07-21 DIAGNOSIS — R2689 Other abnormalities of gait and mobility: Secondary | ICD-10-CM

## 2023-07-21 DIAGNOSIS — M25611 Stiffness of right shoulder, not elsewhere classified: Secondary | ICD-10-CM

## 2023-07-21 DIAGNOSIS — M6281 Muscle weakness (generalized): Secondary | ICD-10-CM

## 2023-07-21 DIAGNOSIS — R2681 Unsteadiness on feet: Secondary | ICD-10-CM | POA: Diagnosis not present
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
## 2023-07-24 ENCOUNTER — Encounter: Payer: Self-pay | Admitting: Physical Therapy

## 2023-07-24 ENCOUNTER — Ambulatory Visit: Payer: Medicare Other | Admitting: Physical Therapy

## 2023-07-24 DIAGNOSIS — M25611 Stiffness of right shoulder, not elsewhere classified: Secondary | ICD-10-CM | POA: Diagnosis not present

## 2023-07-24 DIAGNOSIS — M6281 Muscle weakness (generalized): Secondary | ICD-10-CM

## 2023-07-24 DIAGNOSIS — R2681 Unsteadiness on feet: Secondary | ICD-10-CM | POA: Diagnosis not present

## 2023-07-24 DIAGNOSIS — R2689 Other abnormalities of gait and mobility: Secondary | ICD-10-CM | POA: Diagnosis not present

## 2023-07-24 DIAGNOSIS — R293 Abnormal posture: Secondary | ICD-10-CM
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
## 2023-07-28 ENCOUNTER — Encounter: Payer: Self-pay | Admitting: Physical Therapy

## 2023-07-28 ENCOUNTER — Ambulatory Visit: Payer: Medicare Other | Admitting: Physical Therapy

## 2023-07-28 DIAGNOSIS — M25611 Stiffness of right shoulder, not elsewhere classified: Secondary | ICD-10-CM | POA: Diagnosis not present

## 2023-07-28 DIAGNOSIS — R293 Abnormal posture: Secondary | ICD-10-CM | POA: Diagnosis not present

## 2023-07-28 DIAGNOSIS — R2681 Unsteadiness on feet: Secondary | ICD-10-CM | POA: Diagnosis not present

## 2023-07-28 DIAGNOSIS — R2689 Other abnormalities of gait and mobility: Secondary | ICD-10-CM

## 2023-07-28 DIAGNOSIS — M6281 Muscle weakness (generalized): Secondary | ICD-10-CM | POA: Diagnosis not present

## 2023-07-31 ENCOUNTER — Ambulatory Visit: Payer: Medicare Other | Admitting: Physical Therapy

## 2023-07-31 ENCOUNTER — Encounter: Payer: Self-pay | Admitting: Physical Therapy

## 2023-07-31 DIAGNOSIS — M6281 Muscle weakness (generalized): Secondary | ICD-10-CM | POA: Diagnosis not present

## 2023-07-31 DIAGNOSIS — R293 Abnormal posture: Secondary | ICD-10-CM

## 2023-07-31 DIAGNOSIS — R2681 Unsteadiness on feet: Secondary | ICD-10-CM | POA: Diagnosis not present

## 2023-07-31 DIAGNOSIS — R2689 Other abnormalities of gait and mobility: Secondary | ICD-10-CM | POA: Diagnosis not present

## 2023-07-31 DIAGNOSIS — M25611 Stiffness of right shoulder, not elsewhere classified: Secondary | ICD-10-CM
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
## 2023-08-04 ENCOUNTER — Ambulatory Visit: Payer: Medicare Other | Attending: Internal Medicine | Admitting: Physical Therapy

## 2023-08-04 ENCOUNTER — Encounter: Payer: Self-pay | Admitting: Physical Therapy

## 2023-08-04 DIAGNOSIS — R293 Abnormal posture: Secondary | ICD-10-CM | POA: Diagnosis not present

## 2023-08-04 DIAGNOSIS — M6281 Muscle weakness (generalized): Secondary | ICD-10-CM | POA: Diagnosis not present

## 2023-08-04 DIAGNOSIS — R2689 Other abnormalities of gait and mobility: Secondary | ICD-10-CM | POA: Diagnosis not present

## 2023-08-04 DIAGNOSIS — M25611 Stiffness of right shoulder, not elsewhere classified: Secondary | ICD-10-CM | POA: Insufficient documentation
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
## 2023-08-06 DIAGNOSIS — M25572 Pain in left ankle and joints of left foot: Secondary | ICD-10-CM | POA: Diagnosis not present

## 2023-08-06 DIAGNOSIS — G546 Phantom limb syndrome with pain: Secondary | ICD-10-CM | POA: Diagnosis not present

## 2023-08-06 DIAGNOSIS — G894 Chronic pain syndrome: Secondary | ICD-10-CM | POA: Diagnosis not present

## 2023-08-06 DIAGNOSIS — Z79891 Long term (current) use of opiate analgesic: Secondary | ICD-10-CM | POA: Diagnosis not present

## 2023-08-07 ENCOUNTER — Encounter: Payer: Self-pay | Admitting: Physical Therapy

## 2023-08-07 ENCOUNTER — Ambulatory Visit: Payer: Medicare Other | Admitting: Physical Therapy

## 2023-08-07 DIAGNOSIS — R2689 Other abnormalities of gait and mobility: Secondary | ICD-10-CM

## 2023-08-07 DIAGNOSIS — R293 Abnormal posture: Secondary | ICD-10-CM | POA: Diagnosis not present

## 2023-08-07 DIAGNOSIS — M25611 Stiffness of right shoulder, not elsewhere classified: Secondary | ICD-10-CM | POA: Diagnosis not present

## 2023-08-07 DIAGNOSIS — M6281 Muscle weakness (generalized): Secondary | ICD-10-CM | POA: Diagnosis not present
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
## 2023-08-11 ENCOUNTER — Encounter: Payer: Self-pay | Admitting: Physical Therapy

## 2023-08-11 ENCOUNTER — Ambulatory Visit: Payer: Medicare Other | Admitting: Physical Therapy

## 2023-08-11 DIAGNOSIS — M6281 Muscle weakness (generalized): Secondary | ICD-10-CM | POA: Diagnosis not present

## 2023-08-11 DIAGNOSIS — M25611 Stiffness of right shoulder, not elsewhere classified: Secondary | ICD-10-CM

## 2023-08-11 DIAGNOSIS — R293 Abnormal posture: Secondary | ICD-10-CM

## 2023-08-11 DIAGNOSIS — R2689 Other abnormalities of gait and mobility: Secondary | ICD-10-CM | POA: Diagnosis not present
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
## 2023-08-14 ENCOUNTER — Ambulatory Visit: Payer: Medicare Other | Admitting: Physical Therapy

## 2023-08-14 ENCOUNTER — Encounter: Payer: Self-pay | Admitting: Physical Therapy

## 2023-08-14 DIAGNOSIS — R293 Abnormal posture: Secondary | ICD-10-CM

## 2023-08-14 DIAGNOSIS — R2689 Other abnormalities of gait and mobility: Secondary | ICD-10-CM

## 2023-08-14 DIAGNOSIS — M25611 Stiffness of right shoulder, not elsewhere classified: Secondary | ICD-10-CM | POA: Diagnosis not present

## 2023-08-14 DIAGNOSIS — M6281 Muscle weakness (generalized): Secondary | ICD-10-CM
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
## 2023-08-18 ENCOUNTER — Encounter: Payer: Medicare Other | Admitting: Physical Therapy

## 2023-08-21 ENCOUNTER — Encounter: Payer: Medicare Other | Admitting: Physical Therapy

## 2023-08-25 ENCOUNTER — Other Ambulatory Visit (HOSPITAL_BASED_OUTPATIENT_CLINIC_OR_DEPARTMENT_OTHER): Payer: Self-pay

## 2023-08-26 DIAGNOSIS — R351 Nocturia: Secondary | ICD-10-CM | POA: Diagnosis not present

## 2023-08-26 DIAGNOSIS — R2681 Unsteadiness on feet: Secondary | ICD-10-CM | POA: Diagnosis not present

## 2023-08-26 DIAGNOSIS — I10 Essential (primary) hypertension: Secondary | ICD-10-CM | POA: Diagnosis not present

## 2023-08-26 DIAGNOSIS — G4739 Other sleep apnea: Secondary | ICD-10-CM | POA: Diagnosis not present

## 2023-08-27 ENCOUNTER — Encounter: Payer: Medicare Other | Admitting: Physical Therapy

## 2023-08-27 ENCOUNTER — Other Ambulatory Visit (INDEPENDENT_AMBULATORY_CARE_PROVIDER_SITE_OTHER): Payer: Self-pay

## 2023-08-27 ENCOUNTER — Ambulatory Visit (INDEPENDENT_AMBULATORY_CARE_PROVIDER_SITE_OTHER): Payer: Medicare Other | Admitting: Physician Assistant

## 2023-08-27 ENCOUNTER — Encounter: Payer: Self-pay | Admitting: Physician Assistant

## 2023-08-27 DIAGNOSIS — M7062 Trochanteric bursitis, left hip: Secondary | ICD-10-CM

## 2023-08-27 DIAGNOSIS — M5442 Lumbago with sciatica, left side: Secondary | ICD-10-CM

## 2023-08-27 DIAGNOSIS — M5441 Lumbago with sciatica, right side: Secondary | ICD-10-CM
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
## 2023-08-28 ENCOUNTER — Encounter: Payer: Medicare Other | Admitting: Physical Therapy

## 2023-09-03 ENCOUNTER — Other Ambulatory Visit: Payer: Medicare Other

## 2023-09-16 ENCOUNTER — Ambulatory Visit (INDEPENDENT_AMBULATORY_CARE_PROVIDER_SITE_OTHER): Payer: Medicare Other | Admitting: Neurology

## 2023-09-16 VITALS — BP 129/84 | HR 67 | Ht 66.0 in | Wt 177.0 lb

## 2023-09-16 DIAGNOSIS — G4733 Obstructive sleep apnea (adult) (pediatric): Secondary | ICD-10-CM

## 2023-09-16 DIAGNOSIS — Z9181 History of falling: Secondary | ICD-10-CM

## 2023-09-16 DIAGNOSIS — Z789 Other specified health status: Secondary | ICD-10-CM | POA: Diagnosis not present

## 2023-09-16 DIAGNOSIS — G4731 Primary central sleep apnea: Secondary | ICD-10-CM | POA: Diagnosis not present
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
## 2023-09-16 NOTE — Patient Instructions (Signed)
 It was nice to see you again. Good job thus far with the BiPAP machine. As discussed, we will keep the settings the same for now and do a recheck in about 4 months.   Please continue using your BiPAP regularly. While your insurance requires that you use BiPAP at least 4 hours each night on 70% of the nights, I recommend, that you not skip any nights and use it throughout the night if you can. Getting used to BiPAP and staying with the treatment long term does take time and patience and discipline. Untreated obstructive sleep apnea when it is moderate to severe can have an adverse impact on cardiovascular health and raise her risk for heart disease, arrhythmias, hypertension, congestive heart failure, stroke and diabetes. Untreated obstructive sleep apnea causes sleep disruption, nonrestorative sleep, and sleep deprivation. This can have an impact on your day to day functioning and cause daytime sleepiness and impairment of cognitive function, memory loss, mood disturbance, and problems focussing. Using BiPAP regularly can improve these symptoms.

## 2023-10-01 DIAGNOSIS — M25572 Pain in left ankle and joints of left foot: Secondary | ICD-10-CM | POA: Diagnosis not present

## 2023-10-01 DIAGNOSIS — G546 Phantom limb syndrome with pain: Secondary | ICD-10-CM | POA: Diagnosis not present

## 2023-10-01 DIAGNOSIS — G894 Chronic pain syndrome: Secondary | ICD-10-CM | POA: Diagnosis not present

## 2023-10-01 DIAGNOSIS — Z79891 Long term (current) use of opiate analgesic: Secondary | ICD-10-CM | POA: Diagnosis not present

## 2023-10-02 DIAGNOSIS — R351 Nocturia: Secondary | ICD-10-CM | POA: Diagnosis not present

## 2023-10-02 DIAGNOSIS — N401 Enlarged prostate with lower urinary tract symptoms: Secondary | ICD-10-CM | POA: Diagnosis not present

## 2023-10-02 DIAGNOSIS — R350 Frequency of micturition: Secondary | ICD-10-CM | POA: Diagnosis not present

## 2023-10-20 DIAGNOSIS — H35412 Lattice degeneration of retina, left eye: Secondary | ICD-10-CM | POA: Diagnosis not present

## 2023-10-20 DIAGNOSIS — H43811 Vitreous degeneration, right eye: Secondary | ICD-10-CM | POA: Diagnosis not present

## 2023-10-20 DIAGNOSIS — H43392 Other vitreous opacities, left eye: Secondary | ICD-10-CM | POA: Diagnosis not present

## 2023-10-20 DIAGNOSIS — H33321 Round hole, right eye: Secondary | ICD-10-CM | POA: Diagnosis not present

## 2023-10-20 DIAGNOSIS — H33312 Horseshoe tear of retina without detachment, left eye: Secondary | ICD-10-CM | POA: Diagnosis not present

## 2023-10-20 DIAGNOSIS — H33322 Round hole, left eye: Secondary | ICD-10-CM | POA: Diagnosis not present

## 2023-10-22 ENCOUNTER — Other Ambulatory Visit (INDEPENDENT_AMBULATORY_CARE_PROVIDER_SITE_OTHER)

## 2023-10-22 ENCOUNTER — Encounter: Payer: Self-pay | Admitting: Gastroenterology

## 2023-10-22 ENCOUNTER — Ambulatory Visit: Payer: Medicare Other | Admitting: Gastroenterology

## 2023-10-22 VITALS — BP 130/80 | HR 97 | Ht 66.0 in | Wt 180.0 lb

## 2023-10-22 DIAGNOSIS — Z800 Family history of malignant neoplasm of digestive organs: Secondary | ICD-10-CM

## 2023-10-22 DIAGNOSIS — R195 Other fecal abnormalities: Secondary | ICD-10-CM

## 2023-10-22 LAB — CBC WITH DIFFERENTIAL/PLATELET
Basophils Absolute: 0.1 10*3/uL (ref 0.0–0.1)
Basophils Relative: 0.4 % (ref 0.0–3.0)
Eosinophils Absolute: 0.4 10*3/uL (ref 0.0–0.7)
Eosinophils Relative: 3.1 % (ref 0.0–5.0)
HCT: 44.9 % (ref 39.0–52.0)
Hemoglobin: 14.7 g/dL (ref 13.0–17.0)
Lymphocytes Relative: 14.6 % (ref 12.0–46.0)
Lymphs Abs: 1.7 10*3/uL (ref 0.7–4.0)
MCHC: 32.8 g/dL (ref 30.0–36.0)
MCV: 88.4 fl (ref 78.0–100.0)
Monocytes Absolute: 1 10*3/uL (ref 0.1–1.0)
Monocytes Relative: 8.7 % (ref 3.0–12.0)
Neutro Abs: 8.4 10*3/uL — ABNORMAL HIGH (ref 1.4–7.7)
Neutrophils Relative %: 73.2 % (ref 43.0–77.0)
Platelets: 512 10*3/uL — ABNORMAL HIGH (ref 150.0–400.0)
RBC: 5.07 Mil/uL (ref 4.22–5.81)
RDW: 14.3 % (ref 11.5–15.5)
WBC: 11.4 10*3/uL — ABNORMAL HIGH (ref 4.0–10.5)

## 2023-10-22 MED ORDER — PEG 3350-KCL-NA BICARB-NACL 420 G PO SOLR: 4000.0000 mL | Freq: Once | ORAL | 0 refills | Status: AC
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
## 2023-10-22 NOTE — Patient Instructions (Addendum)
 We have sent the following medications to your pharmacy for you to pick up at your convenience: Golytely    Please purchase the following medications over the counter and take as directed: 3 days prior to your procedure start Miralax  disslove 1 capful (17gram) in at least 8 ounces of water .  Your provider has requested that you go to the basement level for lab work before leaving today. Press "B" on the elevator. The lab is located at the first door on the left as you exit the elevator.  You have been scheduled for a colonoscopy. Please follow written instructions given to you at your visit today.   If you use inhalers (even only as needed), please bring them with you on the day of your procedure.  DO NOT TAKE 7 DAYS PRIOR TO TEST- Trulicity (dulaglutide) Ozempic, Wegovy (semaglutide) Mounjaro (tirzepatide) Bydureon Bcise (exanatide extended release)  DO NOT TAKE 1 DAY PRIOR TO YOUR TEST Rybelsus (semaglutide) Adlyxin (lixisenatide) Victoza (liraglutide) Byetta (exanatide) ________________________________________________________  Thank you for choosing me and  Gastroenterology.  Dr. Lorella Roles

## 2023-10-23 LAB — IBC + FERRITIN
Ferritin: 71.7 ng/mL (ref 22.0–322.0)
Iron: 71 ug/dL (ref 42–165)
Saturation Ratios: 16 % — ABNORMAL LOW (ref 20.0–50.0)
TIBC: 443.8 ug/dL (ref 250.0–450.0)
Transferrin: 317 mg/dL (ref 212.0–360.0)

## 2023-10-28 ENCOUNTER — Encounter: Payer: Self-pay | Admitting: Gastroenterology

## 2023-11-06 ENCOUNTER — Ambulatory Visit: Admitting: Orthopaedic Surgery

## 2023-11-07 ENCOUNTER — Ambulatory Visit: Admitting: Orthopaedic Surgery

## 2023-11-07 DIAGNOSIS — G5601 Carpal tunnel syndrome, right upper limb: Secondary | ICD-10-CM
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
## 2023-11-17 ENCOUNTER — Telehealth: Payer: Self-pay | Admitting: Orthopaedic Surgery

## 2023-11-17 NOTE — Telephone Encounter (Signed)
 Called patient to provide available surgery dates for right carpal tunnel release with Dr. Christiane Cowing.  Left voicemail message asking patient to return my call. Provided my name and direct number for scheduling.

## 2023-11-20 ENCOUNTER — Encounter: Payer: Self-pay | Admitting: Gastroenterology

## 2023-11-20 ENCOUNTER — Ambulatory Visit (AMBULATORY_SURGERY_CENTER): Admitting: Gastroenterology

## 2023-11-20 VITALS — BP 126/78 | HR 85 | Temp 98.4°F | Resp 16 | Ht 66.0 in | Wt 180.0 lb

## 2023-11-20 DIAGNOSIS — Z1211 Encounter for screening for malignant neoplasm of colon: Secondary | ICD-10-CM | POA: Diagnosis not present

## 2023-11-20 DIAGNOSIS — K648 Other hemorrhoids: Secondary | ICD-10-CM | POA: Diagnosis not present

## 2023-11-20 DIAGNOSIS — Z800 Family history of malignant neoplasm of digestive organs: Secondary | ICD-10-CM

## 2023-11-20 DIAGNOSIS — R195 Other fecal abnormalities: Secondary | ICD-10-CM

## 2023-11-20 MED ORDER — SODIUM CHLORIDE 0.9 % IV SOLN: 500.0000 mL | INTRAVENOUS | Status: DC

## 2023-11-20 NOTE — Patient Instructions (Addendum)
 - Internal hemorrhoids                                                    - Resume previous diet.                           - Continue present medications.                           - Future routine screening colonoscopy not                            recommended due to age and current guidelines.                           In addition, future FOBT is NOT recommended, as it                            is likely to be positive in this patient with iron                             deficiency anemia from known NSAID-induced small                            bowel ulcerations.  YOU HAD AN ENDOSCOPIC PROCEDURE TODAY AT THE [facility]:   Refer to the procedure report that was given to you for any specific questions about what was found during the examination.  If the procedure report does not answer your questions, please call your gastroenterologist to clarify.  If you requested that your care partner not be given the details of your procedure findings, then the procedure report has been included in a sealed envelope for you to review at your convenience later.  YOU SHOULD EXPECT: Some feelings of bloating in the abdomen. Passage of more gas than usual.  Walking can help get rid of the air that was put into your GI tract during the procedure and reduce the bloating. If you had a lower endoscopy (such as a colonoscopy or flexible sigmoidoscopy) you may notice spotting of blood in your stool or on the toilet paper. If you underwent a bowel prep for your procedure, you may not have a normal bowel movement for a few days.  Please Note:  You might notice some irritation and congestion in your nose or some drainage.  This is from the oxygen used during your procedure.  There is no need for concern and it should clear up in a day or so.  SYMPTOMS TO REPORT IMMEDIATELY:  Following lower endoscopy (colonoscopy or flexible sigmoidoscopy):  Excessive amounts of blood in the  stool  Significant tenderness or worsening of abdominal pains  Swelling of the abdomen that is new, acute  Fever of 100F or higher   For urgent or emergent issues, a gastroenterologist can be reached at any hour by calling [phone]. Do not use MyChart messaging for urgent concerns.    DIET:  We do recommend a small meal at first, but then you may proceed to your regular  diet.  Drink plenty of fluids but you should avoid alcoholic beverages for 24 hours.  ACTIVITY:  You should plan to take it easy for the rest of today and you should NOT DRIVE or use heavy machinery until tomorrow (because of the sedation medicines used during the test).    FOLLOW UP: Our staff will call the number listed on your records the next business day following your procedure.  We will call around 7:15- 8:00 am to check on you and address any questions or concerns that you may have regarding the information given to you following your procedure. If we do not reach you, we will leave a message.     If any biopsies were taken you will be contacted by phone or by letter within the next 1-3 weeks.  Please call us  at [phone] if you have not heard about the biopsies in 3 weeks.    SIGNATURES/CONFIDENTIALITY: You and/or your care partner have signed paperwork which will be entered into your electronic medical record.  These signatures attest to the fact that that the information above on your After Visit Summary has been reviewed and is understood.  Full responsibility of the confidentiality of this discharge information lies with you and/or your care-partner.

## 2023-11-20 NOTE — Progress Notes (Signed)
 Pt's states no medical or surgical changes since previsit or office visit.

## 2023-11-20 NOTE — Progress Notes (Signed)
 No significant changes to clinical history since GI office visit on 10/22/23.  The patient is appropriate for an endoscopic procedure in the ambulatory setting.  - Lorella Roles, MD

## 2023-11-20 NOTE — Op Note (Signed)
 [facility] Patient Name: Allen Taylor Procedure Date: 11/20/2023 2:13 PM MRN: [id number] Endoscopist: Ace Abu L. Dominic Friendly , MD, [id number] Age: 78 Referring MD:  Date of Birth: 02-25-1946 Gender: Male Account #: [id number] Procedure:                Colonoscopy Indications:              Heme positive stool                           clinical details in recent office consult note Medicines:                Monitored Anesthesia Care Procedure:                Pre-Anesthesia Assessment:                           - Prior to the procedure, a History and Physical                            was performed, and patient medications and                            allergies were reviewed. The patient's tolerance of                            previous anesthesia was also reviewed. The risks                            and benefits of the procedure and the sedation                            options and risks were discussed with the patient.                            All questions were answered, and informed consent                            was obtained. Prior Anticoagulants: The patient has                            taken no anticoagulant or antiplatelet agents. ASA                            Grade Assessment: II - A patient with mild systemic                            disease. After reviewing the risks and benefits,                            the patient was deemed in satisfactory condition to                            undergo the procedure.  After obtaining informed consent, the colonoscope                            was passed under direct vision. Throughout the                            procedure, the patient's blood pressure, pulse, and                            oxygen saturations were monitored continuously. The                            Olympus Scope [id number] was introduced through the                            anus and advanced to the the cecum,  identified by                            appendiceal orifice and ileocecal valve. The                            colonoscopy was performed without difficulty. The                            patient tolerated the procedure well. The quality                            of the bowel preparation was good. The ileocecal                            valve, appendiceal orifice, and rectum were                            photographed. Scope In: 2:19:18 PM Scope Out: 2:30:47 PM Scope Withdrawal Time: 0 hours 9 minutes 3 seconds  Total Procedure Duration: 0 hours 11 minutes 29 seconds  Findings:                 The perianal and digital rectal examinations were                            normal.                           Repeat examination of right colon under NBI                            performed.                           Internal hemorrhoids were found. The hemorrhoids                            were small.  The exam was otherwise without abnormality on                            direct and retroflexion views. Complications:            No immediate complications. Estimated Blood Loss:     Estimated blood loss: none. Impression:               - Internal hemorrhoids.                           - The examination was otherwise normal on direct                            and retroflexion views.                           - No specimens collected. Recommendation:           - Patient has a contact number available for                            emergencies. The signs and symptoms of potential                            delayed complications were discussed with the                            patient. Return to normal activities tomorrow.                            Written discharge instructions were provided to the                            patient.                           - Resume previous diet.                           - Continue present medications.                            - Future routine screening colonoscopy not                            recommended due to age and current guidelines.                           In addition, future FOBT is NOT recommended, as it                            is likely to be positive in this patient with iron                             deficiency anemia from known NSAID-induced small  bowel ulcerations. Vanna Sailer L. Dominic Friendly, MD 11/20/2023 2:35:35 PM This report has been signed electronically.

## 2023-11-20 NOTE — Progress Notes (Signed)
 Sedate, gd SR, tolerated procedure well, VSS, report to RN

## 2023-11-21 ENCOUNTER — Telehealth: Payer: Self-pay | Admitting: *Deleted

## 2023-11-21 NOTE — Telephone Encounter (Signed)
  Follow up Call-     11/20/2023    1:24 PM  Call back number  Post procedure Call Back phone  # [phone]  Permission to leave phone message Yes     Patient questions:  Message left to call us  if necessary.

## 2023-11-26 DIAGNOSIS — G894 Chronic pain syndrome: Secondary | ICD-10-CM | POA: Diagnosis not present

## 2023-11-26 DIAGNOSIS — M25572 Pain in left ankle and joints of left foot: Secondary | ICD-10-CM | POA: Diagnosis not present

## 2023-11-26 DIAGNOSIS — Z79891 Long term (current) use of opiate analgesic: Secondary | ICD-10-CM | POA: Diagnosis not present

## 2023-11-26 DIAGNOSIS — G546 Phantom limb syndrome with pain: Secondary | ICD-10-CM | POA: Diagnosis not present

## 2023-12-10 ENCOUNTER — Other Ambulatory Visit: Payer: Self-pay | Admitting: Physician Assistant

## 2023-12-10 MED ORDER — ONDANSETRON HCL 4 MG PO TABS: 4.0000 mg | ORAL_TABLET | Freq: Three times a day (TID) | ORAL | 0 refills | Status: AC | PRN

## 2023-12-10 MED ORDER — HYDROCODONE-ACETAMINOPHEN 5-325 MG PO TABS: 1.0000 | ORAL_TABLET | Freq: Three times a day (TID) | ORAL | 0 refills | Status: AC | PRN

## 2023-12-11 DIAGNOSIS — G5601 Carpal tunnel syndrome, right upper limb: Secondary | ICD-10-CM | POA: Diagnosis not present

## 2023-12-19 ENCOUNTER — Ambulatory Visit: Admitting: Physician Assistant

## 2023-12-19 DIAGNOSIS — Z9889 Other specified postprocedural states: Secondary | ICD-10-CM

## 2023-12-19 DIAGNOSIS — G5601 Carpal tunnel syndrome, right upper limb: Secondary | ICD-10-CM
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
## 2023-12-23 DIAGNOSIS — M5416 Radiculopathy, lumbar region: Secondary | ICD-10-CM | POA: Diagnosis not present

## 2023-12-23 DIAGNOSIS — G4739 Other sleep apnea: Secondary | ICD-10-CM | POA: Diagnosis not present

## 2023-12-23 DIAGNOSIS — R2681 Unsteadiness on feet: Secondary | ICD-10-CM | POA: Diagnosis not present

## 2023-12-23 DIAGNOSIS — R739 Hyperglycemia, unspecified: Secondary | ICD-10-CM | POA: Diagnosis not present

## 2023-12-23 DIAGNOSIS — F329 Major depressive disorder, single episode, unspecified: Secondary | ICD-10-CM | POA: Diagnosis not present

## 2023-12-26 ENCOUNTER — Ambulatory Visit (INDEPENDENT_AMBULATORY_CARE_PROVIDER_SITE_OTHER): Admitting: Physician Assistant

## 2023-12-26 DIAGNOSIS — Z9889 Other specified postprocedural states: Secondary | ICD-10-CM

## 2023-12-26 DIAGNOSIS — G5601 Carpal tunnel syndrome, right upper limb: Secondary | ICD-10-CM
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
## 2024-01-09 ENCOUNTER — Ambulatory Visit (INDEPENDENT_AMBULATORY_CARE_PROVIDER_SITE_OTHER): Admitting: Orthopaedic Surgery

## 2024-01-09 DIAGNOSIS — Z9889 Other specified postprocedural states: Secondary | ICD-10-CM
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
## 2024-01-19 ENCOUNTER — Encounter: Payer: Self-pay | Admitting: Neurology

## 2024-01-19 ENCOUNTER — Ambulatory Visit (INDEPENDENT_AMBULATORY_CARE_PROVIDER_SITE_OTHER): Admitting: Neurology

## 2024-01-19 VITALS — BP 145/92 | HR 77 | Ht 65.5 in | Wt 178.4 lb

## 2024-01-19 DIAGNOSIS — G4731 Primary central sleep apnea: Secondary | ICD-10-CM | POA: Diagnosis not present

## 2024-01-19 DIAGNOSIS — G4733 Obstructive sleep apnea (adult) (pediatric): Secondary | ICD-10-CM | POA: Diagnosis not present

## 2024-01-19 DIAGNOSIS — Z789 Other specified health status: Secondary | ICD-10-CM
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
## 2024-01-19 NOTE — Patient Instructions (Signed)
 Please continue with your auto BiPAP machine, increase the humidity setting for mouth dryness.  Talk to your providers about cutting back on your medications including amitriptyline , Xanax , hydrocodone , Flexeril  and Robaxin .  Please cut back on your coffee intake and limit yourself to 1 or 2 servings per day ideally, avoid caffeine after 3 PM. As discussed, I will order a designated BiPAP titration study in the sleep lab.  You should hear from the sleep lab study coordinator within the next 2 weeks.  We will plan a follow-up afterwards.

## 2024-01-21 DIAGNOSIS — G546 Phantom limb syndrome with pain: Secondary | ICD-10-CM | POA: Diagnosis not present

## 2024-01-21 DIAGNOSIS — Z79891 Long term (current) use of opiate analgesic: Secondary | ICD-10-CM | POA: Diagnosis not present

## 2024-01-21 DIAGNOSIS — G894 Chronic pain syndrome: Secondary | ICD-10-CM | POA: Diagnosis not present

## 2024-01-21 DIAGNOSIS — M25572 Pain in left ankle and joints of left foot: Secondary | ICD-10-CM | POA: Diagnosis not present

## 2024-01-29 ENCOUNTER — Telehealth: Payer: Self-pay | Admitting: Neurology

## 2024-01-29 NOTE — Telephone Encounter (Signed)
 BiPAP-- Medicare/tricare for life no auth req   Patient is scheduled at [facility] for 03/08/24 at 8 pm.  Mailed packet and sent mychart.

## 2024-01-30 ENCOUNTER — Encounter: Payer: Self-pay | Admitting: Neurology

## 2024-02-18 DIAGNOSIS — Z79891 Long term (current) use of opiate analgesic: Secondary | ICD-10-CM | POA: Diagnosis not present

## 2024-02-18 DIAGNOSIS — M25572 Pain in left ankle and joints of left foot: Secondary | ICD-10-CM | POA: Diagnosis not present

## 2024-02-18 DIAGNOSIS — G894 Chronic pain syndrome: Secondary | ICD-10-CM | POA: Diagnosis not present

## 2024-02-18 DIAGNOSIS — G546 Phantom limb syndrome with pain: Secondary | ICD-10-CM | POA: Diagnosis not present

## 2024-03-08 ENCOUNTER — Encounter

## 2024-03-10 NOTE — Telephone Encounter (Signed)
 We had to r/s patient SS appt from 03/08/24 due to the system was down.  The paent has been r/s for 04/05/24 at 9 pm.  Mailed packet  and sent [employer].

## 2024-04-05 ENCOUNTER — Ambulatory Visit: Admitting: Neurology

## 2024-04-05 DIAGNOSIS — G4733 Obstructive sleep apnea (adult) (pediatric): Secondary | ICD-10-CM

## 2024-04-05 DIAGNOSIS — Z789 Other specified health status: Secondary | ICD-10-CM

## 2024-04-05 DIAGNOSIS — G4731 Primary central sleep apnea: Secondary | ICD-10-CM | POA: Diagnosis not present

## 2024-04-05 DIAGNOSIS — G4720 Circadian rhythm sleep disorder, unspecified type: Secondary | ICD-10-CM

## 2024-04-05 DIAGNOSIS — G4761 Periodic limb movement disorder: Secondary | ICD-10-CM

## 2024-04-05 DIAGNOSIS — R9431 Abnormal electrocardiogram [ECG] [EKG]: Secondary | ICD-10-CM

## 2024-04-12 NOTE — Procedures (Unsigned)
 Physician Interpretation: Please see link under Procedure Tab or under Encounters tab for physician report, technical report, as well as O2 titration and/or PAP titration tables (if applicable).

## 2024-04-13 ENCOUNTER — Ambulatory Visit: Payer: Self-pay | Admitting: Neurology

## 2024-04-13 DIAGNOSIS — R9431 Abnormal electrocardiogram [ECG] [EKG]: Secondary | ICD-10-CM

## 2024-04-13 DIAGNOSIS — G4731 Primary central sleep apnea: Secondary | ICD-10-CM

## 2024-04-13 DIAGNOSIS — G4733 Obstructive sleep apnea (adult) (pediatric): Secondary | ICD-10-CM

## 2024-04-14 DIAGNOSIS — G546 Phantom limb syndrome with pain: Secondary | ICD-10-CM | POA: Diagnosis not present

## 2024-04-14 DIAGNOSIS — G894 Chronic pain syndrome: Secondary | ICD-10-CM | POA: Diagnosis not present

## 2024-04-14 DIAGNOSIS — Z79891 Long term (current) use of opiate analgesic: Secondary | ICD-10-CM | POA: Diagnosis not present

## 2024-04-14 DIAGNOSIS — M25572 Pain in left ankle and joints of left foot: Secondary | ICD-10-CM | POA: Diagnosis not present

## 2024-04-22 NOTE — Telephone Encounter (Signed)
-----   Message from [name] sent at 04/13/2024  6:02 PM EDT ----- Patient has been on home autoBiPAP and came in for a lab titration study to optimize treatment settings on 04/05/24. I would recommend home BiPAP ST, which is a different machine from what he has.  Please advise patient that I place an order and we will send it to his DME. He will need a FU in the sleep clinic with me or NP in 60-90 d post set up date.   Pls advise pt that his EKG showed frequent extra beats called PVCs. I recommend he FU with PCP to get a full EKG and to discuss consultation w cardiology, if he has not seen one (not in Epic, maybe  through [facility]?).   SA ----- Message ----- From: Rebecka Fleeta Higashi In [facility] [employer] One Sent: 04/13/2024   5:58 PM EDT To: [name], MD

## 2024-04-22 NOTE — Telephone Encounter (Signed)
 Called and LMVM for pt to return call about sleep study results.

## 2024-04-26 NOTE — Telephone Encounter (Signed)
 Spoke with patient and discussed his SS results as noted below. Pt agreed to bipap st setup as well as following up with PCP regarding PVCs seen on EKG during SS. Patient aware to be seen here between 60 and 90 days after setup on Bipap ST. He will call us  upon receipt of machine to schedule the appt. His questions were answered and he verbalized appreciation.   Order sent to [facility]. Sleep study report sent to PCP.

## 2024-04-26 NOTE — Telephone Encounter (Signed)
-----   Message from [name] sent at 04/13/2024  6:02 PM EDT ----- Patient has been on home autoBiPAP and came in for a lab titration study to optimize treatment settings on 04/05/24. I would recommend home BiPAP ST, which is a different machine from what he has.  Please advise patient that I place an order and we will send it to his DME. He will need a FU in the sleep clinic with me or NP in 60-90 d post set up date.   Pls advise pt that his EKG showed frequent extra beats called PVCs. I recommend he FU with PCP to get a full EKG and to discuss consultation w cardiology, if he has not seen one (not in Epic, maybe  through [facility]?).   SA ----- Message ----- From: Rebecka Fleeta Higashi In [facility] [employer] One Sent: 04/13/2024   5:58 PM EDT To: [name], MD

## 2024-04-29 DIAGNOSIS — Z23 Encounter for immunization: Secondary | ICD-10-CM | POA: Diagnosis not present

## 2024-05-03 ENCOUNTER — Encounter: Payer: Self-pay | Admitting: Radiology

## 2024-05-31 ENCOUNTER — Telehealth: Payer: Self-pay | Admitting: Neurology

## 2024-05-31 NOTE — Telephone Encounter (Signed)
 Called and LVM for pt to call back and schedule an Initial BiPapa appt. Pt is to be scheduled between 06/23/24-08/22/24.

## 2024-06-09 ENCOUNTER — Other Ambulatory Visit (INDEPENDENT_AMBULATORY_CARE_PROVIDER_SITE_OTHER)

## 2024-06-09 ENCOUNTER — Encounter: Payer: Self-pay | Admitting: Orthopaedic Surgery

## 2024-06-09 ENCOUNTER — Ambulatory Visit (INDEPENDENT_AMBULATORY_CARE_PROVIDER_SITE_OTHER): Admitting: Orthopaedic Surgery

## 2024-06-09 DIAGNOSIS — Z96642 Presence of left artificial hip joint: Secondary | ICD-10-CM | POA: Diagnosis not present

## 2024-06-09 MED ORDER — METHYLPREDNISOLONE ACETATE 40 MG/ML IJ SUSP
40.0000 mg | INTRAMUSCULAR | Status: AC | PRN
  Administered 2024-06-09: 40 mg via INTRA_ARTICULAR

## 2024-06-09 MED ORDER — LIDOCAINE HCL 1 % IJ SOLN
3.0000 mL | INTRAMUSCULAR | Status: AC | PRN
  Administered 2024-06-09: 3 mL

## 2024-06-09 NOTE — Progress Notes (Signed)
 The patient is an 78 year old gentleman well-known to us .  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  He has been having hip pain for about 5 weeks now after he had a hard fall landing on his right side but he points to the trochanteric area and the low back on the left side as a source of his pain.  He is walking with a limp.  He is not using an assistive device.  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  He feels like he may have damaged the prosthesis and he is seeing an orthotics and prosthetics specialist next week.  His wife is with him.  Both she and I feel like he is overdoing things as well.  On exam his left hip moves smoothly and fluidly.  There is pain over the trochanteric area of the hip and some on the left side of his low back on the facet joints.  An AP pelvis and lateral left hip is seen today and compared to previous x-rays from 9 months ago.  There is no fracture around the left hip or pelvis that can be seen at all and no acute findings.  The replacement itself and components are in good position and well aligned.  Since he was tender over the trochanteric area I did recommend an injection over this area and he agreed to this and tolerated it well.  I do feel that his limp is also coming from his right below-knee prosthesis that will be checked out next week.  If things are worsening for him he knows to let us  know.  All question concerns were answered and addressed.  Follow-up from orthopedic standpoint for now is as needed.    Procedure Note  Patient: SEDRIC GUIA III             Date of Birth: [birth year]           MRN: [id number]             Visit Date: 06/09/2024  Procedures: Visit Diagnoses:  1. History of left hip replacement     Large Joint Inj: L greater trochanter on 06/09/2024 9:28 AM Indications: pain and diagnostic evaluation Details: 22 G 1.5 in needle, lateral approach  Arthrogram: No  Medications:  3 mL lidocaine  1 %; 40 mg methylPREDNISolone  acetate 40 MG/ML Outcome: tolerated well, no immediate complications Procedure, treatment alternatives, risks and benefits explained, specific risks discussed. Consent was given by the patient. Immediately prior to procedure a time out was called to verify the correct patient, procedure, equipment, support staff and site/side marked as required. Patient was prepped and draped in the usual sterile fashion.

## 2024-06-14 DIAGNOSIS — G546 Phantom limb syndrome with pain: Secondary | ICD-10-CM | POA: Diagnosis not present

## 2024-06-14 DIAGNOSIS — Z79891 Long term (current) use of opiate analgesic: Secondary | ICD-10-CM | POA: Diagnosis not present

## 2024-06-14 DIAGNOSIS — M25572 Pain in left ankle and joints of left foot: Secondary | ICD-10-CM | POA: Diagnosis not present

## 2024-06-14 DIAGNOSIS — G894 Chronic pain syndrome: Secondary | ICD-10-CM | POA: Diagnosis not present

## 2024-08-12 ENCOUNTER — Ambulatory Visit: Admitting: Physical Therapy
